# Patient Record
Sex: Male | Born: 1970 | Race: Black or African American | Hispanic: No | Marital: Married | Smoking: Current some day smoker
Health system: Southern US, Community
[De-identification: ages and names within clinical notes are randomized; demographics above are authoritative.]

## PROBLEM LIST (undated history)

## (undated) DIAGNOSIS — I10 Essential (primary) hypertension: Principal | ICD-10-CM

## (undated) DIAGNOSIS — E785 Hyperlipidemia, unspecified: Secondary | ICD-10-CM

## (undated) DIAGNOSIS — I639 Cerebral infarction, unspecified: Secondary | ICD-10-CM

## (undated) HISTORY — DX: Essential (primary) hypertension: I10

## (undated) HISTORY — DX: Hyperlipidemia, unspecified: E78.5

## (undated) HISTORY — DX: Cerebral infarction, unspecified: I63.9

## (undated) HISTORY — PX: ACHILLES TENDON REPAIR: SUR1153

## (undated) HISTORY — PX: NO PAST SURGERIES: SHX2092

---

## 2011-10-03 DIAGNOSIS — R7989 Other specified abnormal findings of blood chemistry: Secondary | ICD-10-CM | POA: Insufficient documentation

## 2011-10-03 DIAGNOSIS — K429 Umbilical hernia without obstruction or gangrene: Secondary | ICD-10-CM | POA: Insufficient documentation

## 2011-10-03 DIAGNOSIS — R945 Abnormal results of liver function studies: Secondary | ICD-10-CM | POA: Insufficient documentation

## 2011-12-18 ENCOUNTER — Encounter: Payer: Self-pay | Admitting: Internal Medicine

## 2011-12-18 ENCOUNTER — Ambulatory Visit (INDEPENDENT_AMBULATORY_CARE_PROVIDER_SITE_OTHER): Payer: BC Managed Care – PPO | Admitting: Internal Medicine

## 2011-12-18 VITALS — BP 122/80 | HR 68 | Temp 97.9°F | Ht 74.0 in | Wt 245.0 lb

## 2011-12-18 DIAGNOSIS — I10 Essential (primary) hypertension: Secondary | ICD-10-CM

## 2011-12-18 DIAGNOSIS — E785 Hyperlipidemia, unspecified: Secondary | ICD-10-CM

## 2011-12-18 HISTORY — DX: Essential (primary) hypertension: I10

## 2011-12-18 HISTORY — DX: Hyperlipidemia, unspecified: E78.5

## 2011-12-18 MED ORDER — AMLODIPINE BESY-BENAZEPRIL HCL 10-20 MG PO CAPS: 1.0000 | ORAL_CAPSULE | Freq: Every day | ORAL | Status: DC

## 2011-12-18 NOTE — Assessment & Plan Note (Signed)
Recently diagnosed with hypertension, was a started on Lotrel 5/10 few weeks ago, no apparent side effects, reportedly had his blood was checked after he started that medication. Initially, BP was great but in the last couple of weeks, BP was a little higher , yesterday was 160/94 and had palpitation, went to a urgent care, EKG reportedly normal. Feels well today. Plan: Get records, I'm  interested to see a  BMP after he started Lotrel, also see his EKG;  if he didn't have a BMP after ACEi initiation,I will ask patient to come back for blood work Increase Lotrel to 10 /20 Patient will call if he feels the medication is too strong for him ie if BP is dropping less than 110/60 Encourage to stay active, we also discussed a low-salt diet Followup 6 weeks for a physical

## 2011-12-18 NOTE — Patient Instructions (Addendum)
Check the  blood pressure 2 or 3 times a week, be sure it is between 110/60 and 135/85. If it is consistently higher or lower, let me know Get the records, labs and EKGs from your previous MD    Sodium-Controlled Diet Sodium is a mineral. It is found in many foods. Sodium may be found naturally or added during the making of a food. The most common form of sodium is salt, which is made up of sodium and chloride. Reducing your sodium intake involves changing your eating habits. The following guidelines will help you reduce the sodium in your diet:  Stop using the salt shaker.  Use salt sparingly in cooking and baking.  Substitute with sodium-free seasonings and spices.  Do not use a salt substitute (potassium chloride) without your caregiver's permission.  Include a variety of fresh, unprocessed foods in your diet.  Limit the use of processed and convenience foods that are high in sodium. USE THE FOLLOWING FOODS SPARINGLY: Breads/Starches  Commercial bread stuffing, commercial pancake or waffle mixes, coating mixes. Waffles. Croutons. Prepared (boxed or frozen) potato, rice, or noodle mixes that contain salt or sodium. Salted [place] fries or hash browns. Salted popcorn, breads, crackers, chips, or snack foods. Vegetables  Vegetables canned with salt or prepared in cream, butter, or cheese sauces. Sauerkraut. Tomato or vegetable juices canned with salt.  Fresh vegetables are allowed if rinsed thoroughly. Fruit  Fruit is okay to eat. Meat and Meat Substitutes  Salted or smoked meats, such as bacon or Canadian bacon, chipped or corned beef, hot dogs, salt pork, luncheon meats, pastrami, ham, or sausage. Canned or smoked fish, poultry, or meat. Processed cheese or cheese spreads, blue or Roquefort cheese. Battered or frozen fish products. Prepared spaghetti sauce. Baked beans. Reuben sandwiches. Salted nuts. Caviar. Milk  Limit buttermilk to 1 cup per week. Soups and Combination  Foods  Bouillon cubes, canned or dried soups, broth, consomm. Convenience (frozen or packaged) dinners with more than 600 mg sodium. Pot pies, pizza, Asian food, fast food cheeseburgers, and specialty sandwiches. Desserts and Sweets  Regular (salted) desserts, pie, commercial fruit snack pies, commercial snack cakes, canned puddings.  Eat desserts and sweets in moderation. Fats and Oils  Gravy mixes or canned gravy. No more than 1 to 2 tbs of salad dressing. Chip dips.  Eat fats and oils in moderation. Beverages  See those listed under the vegetables and milk groups. Condiments  Ketchup, mustard, meat sauces, salsa, regular (salted) and lite soy sauce or mustard. Dill pickles, olives, meat tenderizer. Prepared horseradish or pickle relish. Dutch-processed cocoa. Baking powder or baking soda used medicinally. Worcestershire sauce. "Light" salt. Salt substitute, unless approved by your caregiver. Document Released: 08/24/2001 Document Revised: 05/27/2011 Document Reviewed: 03/27/2009 [facility] Patient Information 2013 [place], [place].

## 2011-12-18 NOTE — Progress Notes (Signed)
  Subjective:    Patient ID: Tony Walker, male    DOB: [birth year], 41 y.o.   MRN: [id number]  HPI New patient, here to establish Patient was diagnosed with hypertension few weeks ago, started on Lotrel, reports that after he started such medicine his blood  was a recheck and he was told it was okay. BP initially was very good, he is trying to eat healthier and is getting more active. In the last couple of weeks, his BP was slightly elevated, he admits he has been eating more salt lately and has forgotten a few doses of medication. Yesterday, his BP went up to 160/94, he had some palpitation, went to urgent care, reportedly EKG was normal and he was recommended to continue with Lotrel 5-10 and work on his lifestyle. Today he feels well.   Past medical history Hypertension, dx 2013  Hyperlipidemia  Past surgical history None  Social history Moved to the area ~ 2011, from [place] Occupation-- Psychologist, occupational tobacco-- used to smoke cigars, d/c ~ 03-2011 ETOH-- socially  Diet-- healthier recently  Exercise-- goes to the [employer] ~ 3-4/week  Family history DM-HTN-hyperlipidemia-- F and M side  CAD-- no Stroke-- no Colon cancer-- no Prostate cancer-- no Breast ca-- aunts   Review of Systems Denies lower extremity edema, cough or wheezing No nausea, vomiting, diarrhea No chest pain but again yesterday he had some palpitations.    Objective:   Physical Exam General -- alert, well-developed, and BMI 31. No apparent distress.  Neck --no thyromegaly HEENT -- not pale or jaundice Lungs -- normal respiratory effort, no intercostal retractions, no accessory muscle use, and normal breath sounds.   Heart-- normal rate, regular rhythm, no murmur, and no gallop.   Extremities-- no pretibial edema bilaterally  Neurologic-- alert & oriented X3 and strength normal in all extremities. Psych-- Cognition and judgment appear intact. Alert and cooperative with normal attention span and concentration.   not anxious appearing and not depressed appearing.       Assessment & Plan:  Td 2006 patient

## 2012-01-04 ENCOUNTER — Telehealth: Payer: Self-pay | Admitting: Internal Medicine

## 2012-01-04 DIAGNOSIS — I10 Essential (primary) hypertension: Secondary | ICD-10-CM

## 2012-01-04 NOTE — Telephone Encounter (Signed)
Advise patient, I haven't seen any old records so far. Please schedule ---->  BMP DX hypertension

## 2012-01-06 NOTE — Telephone Encounter (Signed)
Left detailed msg for pt to return call to schedule lab appt. Lab orders entered.

## 2012-01-29 ENCOUNTER — Emergency Department (HOSPITAL_BASED_OUTPATIENT_CLINIC_OR_DEPARTMENT_OTHER)
Admission: EM | Admit: 2012-01-29 | Discharge: 2012-01-29 | Disposition: A | Discharge status: Home / Self Care | Payer: BC Managed Care – PPO | Attending: Emergency Medicine | Admitting: Emergency Medicine

## 2012-01-29 ENCOUNTER — Emergency Department (HOSPITAL_BASED_OUTPATIENT_CLINIC_OR_DEPARTMENT_OTHER): Payer: BC Managed Care – PPO

## 2012-01-29 ENCOUNTER — Encounter (HOSPITAL_BASED_OUTPATIENT_CLINIC_OR_DEPARTMENT_OTHER): Payer: Self-pay | Admitting: *Deleted

## 2012-01-29 ENCOUNTER — Telehealth: Payer: Self-pay | Admitting: Internal Medicine

## 2012-01-29 DIAGNOSIS — I10 Essential (primary) hypertension: Secondary | ICD-10-CM

## 2012-01-29 DIAGNOSIS — E785 Hyperlipidemia, unspecified: Secondary | ICD-10-CM | POA: Insufficient documentation

## 2012-01-29 LAB — BASIC METABOLIC PANEL
Calcium: 10 mg/dL (ref 8.4–10.5)
GFR calc Af Amer: 85 mL/min — ABNORMAL LOW (ref >90)
GFR calc non Af Amer: 74 mL/min — ABNORMAL LOW (ref >90)
Glucose, Bld: 106 mg/dL — ABNORMAL HIGH (ref 70–99)
Sodium: 139 mEq/L (ref 135–145)

## 2012-01-29 LAB — CBC
Hemoglobin: 12.9 g/dL — ABNORMAL LOW (ref 13.0–17.0)
MCH: 30.6 pg (ref 26.0–34.0)
MCHC: 33.2 g/dL (ref 30.0–36.0)
RDW: 12 % (ref 11.5–15.5)

## 2012-01-29 NOTE — Telephone Encounter (Signed)
Caller: [name]/Patient; Patient Name: Tony Walker; PCP: Willow Ora; Best Callback Phone Number: [phone]. States that the past couple of days blood pressure has been up and down. At night heart "pounds" and feels like he is going to have a heart attack some nights. Has missed one day of his medication on Monday, 01/27/12. Blood pressure normally runs 128-140/80's. Was recently changed from 5MG  to 10MG  of Amlodipine approximately 6 weeks ago. Has sweating episodes. Blood pressure is now 181/113, heart rate is 93 and having blurred vision. Advised ED per guidelines.

## 2012-01-29 NOTE — Telephone Encounter (Signed)
To Md for review      KP

## 2012-01-29 NOTE — ED Provider Notes (Signed)
History     CSN: [id number]  Arrival date & time 01/29/12  1758   First MD Initiated Contact with Patient 01/29/12 1810      Chief Complaint  Patient presents with  . Hypertension    (Consider location/radiation/quality/duration/timing/severity/associated sxs/prior treatment) HPI Comments: Pt states that he started having his blood pressure treated about 2 months ago:pt states that the last 2 nights he has had heart palpitations:pt denies cp or sob:pt states that he was having the symptoms earlier today and his bp was 180/113 and he had blurred vision bilaterally which has since resolved  Patient is a 41 y.o. male presenting with hypertension. The history is provided by the patient. No language interpreter was used.  Hypertension This is a chronic problem. The current episode started yesterday. The problem occurs constantly. The problem has been unchanged. Pertinent negatives include no chest pain, congestion, fatigue, fever, joint swelling, neck pain, numbness or vomiting. Nothing aggravates the symptoms. He has tried nothing for the symptoms.    Past Medical History  Diagnosis Date  . Hypertension 12/18/2011  . Hyperlipidemia 12/18/2011    History reviewed. No pertinent past surgical history.  History reviewed. No pertinent family history.  History  Substance Use Topics  . Smoking status: Never Smoker   . Smokeless tobacco: Not on file  . Alcohol Use: No      Review of Systems  Constitutional: Negative for fever and fatigue.  HENT: Negative for congestion and neck pain.   Cardiovascular: Negative for chest pain.  Gastrointestinal: Negative for vomiting.  Musculoskeletal: Negative for joint swelling.  Neurological: Negative for numbness.    Allergies  Review of patient's allergies indicates no known allergies.  Home Medications   Current Outpatient Rx  Name  Route  Sig  Dispense  Refill  . AMLODIPINE BESY-BENAZEPRIL HCL 10-20 MG PO CAPS   Oral   Take 1  capsule by mouth daily.   30 capsule   3   . OMEGA-3 FATTY ACIDS 1000 MG PO CAPS   Oral   Take 1 g by mouth daily.         Marland Kitchen ONE-DAILY MULTI VITAMINS PO TABS   Oral   Take 1 tablet by mouth daily.           BP 162/102  Pulse 92  Temp 98.5 F (36.9 C) (Oral)  Resp 16  Ht 6\' 4"  (1.93 m)  Wt 245 lb (111.131 kg)  BMI 29.82 kg/m2  SpO2 100%  Physical Exam  Nursing note and vitals reviewed. Constitutional: He is oriented to person, place, and time. He appears well-developed and well-nourished.  HENT:  Head: Normocephalic and atraumatic.  Right Ear: External ear normal.  Left Ear: External ear normal.  Eyes: Conjunctivae normal and EOM are normal.  Neck: Normal range of motion. Neck supple.  Cardiovascular: Normal rate and regular rhythm.   Pulmonary/Chest: Effort normal and breath sounds normal.  Abdominal: Soft. Bowel sounds are normal. There is no tenderness.  Musculoskeletal: Normal range of motion.  Neurological: He is alert and oriented to person, place, and time. No cranial nerve deficit. He exhibits normal muscle tone. Coordination normal.  Skin: Skin is warm and dry.  Psychiatric: He has a normal mood and affect.    ED Course  Procedures (including critical care time)  Labs Reviewed  CBC - Abnormal; Notable for the following:    RBC 4.21 (*)     Hemoglobin 12.9 (*)     HCT 38.9 (*)  All other components within normal limits  BASIC METABOLIC PANEL - Abnormal; Notable for the following:    Glucose, Bld 106 (*)     GFR calc non Af Amer 74 (*)     GFR calc Af Amer 85 (*)     All other components within normal limits  TROPONIN I   Dg Chest 2 View  01/29/2012  *RADIOLOGY REPORT*  Clinical Data: Tachycardia.  Elevated blood pressure  CHEST - 2 VIEW  Comparison: None.  Findings: The heart size and mediastinal contours are within normal limits.  Both lungs are clear.  The visualized skeletal structures are unremarkable.  IMPRESSION: Negative exam.    Original Report Authenticated By: Signa Kell, M.D.    Ct Head Wo Contrast  01/29/2012  *RADIOLOGY REPORT*  Clinical Data: Hypertension.  Blurred vision.  Dizziness.  CT HEAD WITHOUT CONTRAST  Technique:  Contiguous axial images were obtained from the base of the skull through the vertex without contrast.  Comparison: None.  Findings: The brain has a normal appearance without evidence of malformation, atrophy, old or acute infarction, mass lesion, hemorrhage, hydrocephalus or extra-axial collection peri.  The calvarium is unremarkable.  Sinuses, middle ears and mastoids are clear.  IMPRESSION: Normal head CT   Original Report Authenticated By: Paulina Fusi, M.D.     Date: 01/29/2012  Rate: 72  Rhythm: normal sinus rhythm  QRS Axis: normal  Intervals: normal  ST/T Wave abnormalities: normal  Conduction Disutrbances:none  Narrative Interpretation:   Old EKG Reviewed: none available    1. HTN (hypertension)       MDM  Pt is symptom free at this time::pt is okay to follow up with pcp:don't think any changes in medications need to be made at this time        Teressa Lower, NP 01/29/12 2115

## 2012-01-29 NOTE — ED Notes (Signed)
Pt c/o hypertension x 2 days with blurred vision

## 2012-01-30 NOTE — Telephone Encounter (Signed)
lmovm for pt to return call.  

## 2012-01-30 NOTE — Telephone Encounter (Signed)
Patient went to the ER, please arrange a office visit for followup of

## 2012-01-30 NOTE — ED Provider Notes (Signed)
Medical screening examination/treatment/procedure(s) were performed by non-physician practitioner and as supervising physician I was immediately available for consultation/collaboration.   Yarethzy Croak, MD 01/30/12 2317 

## 2012-02-04 NOTE — Telephone Encounter (Signed)
Pt has appt scheduled 11.27.13.

## 2012-02-12 ENCOUNTER — Encounter: Payer: BC Managed Care – PPO | Admitting: Internal Medicine

## 2012-02-12 DIAGNOSIS — Z0289 Encounter for other administrative examinations: Secondary | ICD-10-CM

## 2012-05-08 ENCOUNTER — Other Ambulatory Visit: Payer: Self-pay | Admitting: Internal Medicine

## 2012-05-08 NOTE — Telephone Encounter (Signed)
Refill done.  

## 2012-06-26 ENCOUNTER — Telehealth: Payer: Self-pay | Admitting: Internal Medicine

## 2012-06-26 NOTE — Telephone Encounter (Signed)
Okay with me 

## 2012-06-26 NOTE — Telephone Encounter (Signed)
Patient is moving closer to our office and he would like to switch from Dr. Drue Novel to Dr. Yetta Barre, Please advise

## 2012-06-26 NOTE — Telephone Encounter (Signed)
Ok with me 

## 2012-08-12 ENCOUNTER — Ambulatory Visit: Payer: BC Managed Care – PPO | Admitting: Internal Medicine

## 2012-10-20 ENCOUNTER — Telehealth: Payer: Self-pay | Admitting: Internal Medicine

## 2012-10-20 NOTE — Telephone Encounter (Signed)
It appears that pt. PCP is Dr. Sanda Linger at this time, not [facility].

## 2012-10-20 NOTE — Telephone Encounter (Signed)
Pt. Requesting refill on amlodipine-benazepril.  Last OV with Dr. Drue Novel 10.2.13 Switched providers to Dr. Sanda Linger on 4.15.14 but never went to see him (see documentation below) Last filled 2.21.14 #30 with 3 refills by our office.  Please advise.

## 2012-10-20 NOTE — Telephone Encounter (Signed)
I have never seen him.

## 2012-10-22 NOTE — Telephone Encounter (Signed)
Refill done for one month per verbal orders Dr. Drue Novel. Lmovm for pt. To contact office to encourage him to schedule OV with his PCP who is listed as Dr. Yetta Barre.

## 2012-12-04 ENCOUNTER — Other Ambulatory Visit: Payer: Self-pay | Admitting: Internal Medicine

## 2012-12-04 DIAGNOSIS — I10 Essential (primary) hypertension: Secondary | ICD-10-CM

## 2012-12-04 NOTE — Telephone Encounter (Signed)
Reill for lotrel sent to [facility] on [address]

## 2013-01-21 ENCOUNTER — Other Ambulatory Visit: Payer: Self-pay | Admitting: Internal Medicine

## 2013-01-22 NOTE — Telephone Encounter (Signed)
Amlodipine refill sent to pharmacy 

## 2013-02-18 ENCOUNTER — Other Ambulatory Visit: Payer: Self-pay | Admitting: Internal Medicine

## 2013-02-18 NOTE — Telephone Encounter (Signed)
rx refilled per protocol. DJR  

## 2013-04-05 ENCOUNTER — Other Ambulatory Visit: Payer: Self-pay | Admitting: Internal Medicine

## 2013-07-05 ENCOUNTER — Other Ambulatory Visit: Payer: Self-pay | Admitting: Internal Medicine

## 2013-07-06 NOTE — Telephone Encounter (Signed)
Rx sent to the pharmacy by e-script.//AB/CMA 

## 2013-07-07 ENCOUNTER — Ambulatory Visit: Payer: BC Managed Care – PPO | Admitting: Internal Medicine

## 2013-07-09 ENCOUNTER — Ambulatory Visit (INDEPENDENT_AMBULATORY_CARE_PROVIDER_SITE_OTHER): Payer: BC Managed Care – PPO | Admitting: Internal Medicine

## 2013-07-09 ENCOUNTER — Encounter: Payer: Self-pay | Admitting: Internal Medicine

## 2013-07-09 VITALS — BP 122/76 | HR 77 | Temp 97.9°F | Wt 260.0 lb

## 2013-07-09 DIAGNOSIS — I10 Essential (primary) hypertension: Secondary | ICD-10-CM

## 2013-07-09 LAB — CBC WITH DIFFERENTIAL/PLATELET
BASOS ABS: 0.1 10*3/uL (ref 0.0–0.1)
Basophils Relative: 0.8 % (ref 0.0–3.0)
EOS ABS: 0.1 10*3/uL (ref 0.0–0.7)
Eosinophils Relative: 1.4 % (ref 0.0–5.0)
HCT: 43 % (ref 39.0–52.0)
HEMOGLOBIN: 14.1 g/dL (ref 13.0–17.0)
LYMPHS PCT: 33 % (ref 12.0–46.0)
Lymphs Abs: 3.1 10*3/uL (ref 0.7–4.0)
MCHC: 32.8 g/dL (ref 30.0–36.0)
MCV: 94.6 fl (ref 78.0–100.0)
MONOS PCT: 12.9 % — AB (ref 3.0–12.0)
Monocytes Absolute: 1.2 10*3/uL — ABNORMAL HIGH (ref 0.1–1.0)
NEUTROS ABS: 4.8 10*3/uL (ref 1.4–7.7)
NEUTROS PCT: 51.9 % (ref 43.0–77.0)
PLATELETS: 294 10*3/uL (ref 150.0–400.0)
RBC: 4.54 Mil/uL (ref 4.22–5.81)
RDW: 13.4 % (ref 11.5–14.6)
WBC: 9.3 10*3/uL (ref 4.5–10.5)

## 2013-07-09 MED ORDER — AMLODIPINE BESY-BENAZEPRIL HCL 10-20 MG PO CAPS: ORAL_CAPSULE | ORAL | Status: DC

## 2013-07-09 NOTE — Assessment & Plan Note (Signed)
Reports good compliance of medication, BP today is great, reports normal ambulatory BPs. Plan:  Labs Refill medications Come back in 3 months for a physical, fasting. I like to check his cholesterol, at some point was elevated.

## 2013-07-09 NOTE — Progress Notes (Signed)
   Subjective:    Patient ID: Tony Walker, male    DOB: 1970-10-22, 43 y.o.   MRN: [id number]  DOS:  07/09/2013 Type of  visit: Last visit in 2013, here for followup. History of hypertension, reports he has been taking his BP medications regularly. Ambulatory BPs usually 120, 130/80.    ROS In general feeling great, has been doing very well with activity diet, active lately 3 or 4 times a week. Denies chest pain, difficulty breathing or extremity edema Denies cough or sputum production  Past Medical History  Diagnosis Date  . Hypertension 12/18/2011  . Hyperlipidemia 12/18/2011    History reviewed. No pertinent past surgical history.  History   Social History  . Marital Status: Married    Spouse Name: N/A    Number of Children: N/A  . Years of Education: N/A   Occupational History  . Not on file.   Social History Main Topics  . Smoking status: Never Smoker   . Smokeless tobacco: Not on file  . Alcohol Use: No  . Drug Use: No  . Sexual Activity: No   Other Topics Concern  . Not on file   Social History Narrative  . No narrative on file    Family history DM-HTN-hyperlipidemia-- F and M side   CAD-- no Stroke-- no Colon cancer-- no Prostate cancer-- no Breast ca-- aunts    Medication List       This list is accurate as of: 07/09/13 11:59 PM.  Always use your most recent med list.               amLODipine-benazepril 10-20 MG per capsule  Commonly known as:  LOTREL  TAKE 1 CAPSULE EVERY DAY -- NEED OFFICE VISIT FOR FURTHER REFILLS     fish oil-omega-3 fatty acids 1000 MG capsule  Take 1 g by mouth daily.     multivitamin tablet  Take 1 tablet by mouth daily.           Objective:   Physical Exam BP 122/76  Pulse 77  Temp(Src) 97.9 F (36.6 C)  Wt 260 lb (117.935 kg)  SpO2 99%  General -- alert, well-developed, NAD.  Lungs -- normal respiratory effort, no intercostal retractions, no accessory muscle use, and normal breath sounds.  Heart--  normal rate, regular rhythm, no murmur.  Extremities-- no pretibial edema bilaterally  Neurologic--  alert & oriented X3. Speech normal, gait normal, strength normal in all extremities.  Psych-- Cognition and judgment appear intact. Cooperative with normal attention span and concentration. No anxious or depressed appearing.   Assessment & Plan:

## 2013-07-09 NOTE — Progress Notes (Signed)
Pre visit review using our clinic review tool, if applicable. No additional management support is needed unless otherwise documented below in the visit note. 

## 2013-07-09 NOTE — Patient Instructions (Signed)
Get your blood work before you leave   Next visit is for a physical exam in3 months  , fasting Please make an appointment     

## 2013-07-12 ENCOUNTER — Telehealth: Payer: Self-pay | Admitting: Internal Medicine

## 2013-07-12 ENCOUNTER — Encounter: Payer: Self-pay | Admitting: Internal Medicine

## 2013-07-12 LAB — COMPREHENSIVE METABOLIC PANEL
ALBUMIN: 4.9 g/dL (ref 3.5–5.2)
ALT: 53 U/L (ref 0–53)
AST: 57 U/L — ABNORMAL HIGH (ref 0–37)
Alkaline Phosphatase: 47 U/L (ref 39–117)
BUN: 19 mg/dL (ref 6–23)
CALCIUM: 10.1 mg/dL (ref 8.4–10.5)
CHLORIDE: 106 meq/L (ref 96–112)
CO2: 25 meq/L (ref 19–32)
CREATININE: 1.1 mg/dL (ref 0.4–1.5)
GFR: 93.12 mL/min (ref >60.00)
Glucose, Bld: 75 mg/dL (ref 70–99)
POTASSIUM: 3.9 meq/L (ref 3.5–5.1)
Sodium: 141 mEq/L (ref 135–145)
Total Bilirubin: 0.5 mg/dL (ref 0.3–1.2)
Total Protein: 8.2 g/dL (ref 6.0–8.3)

## 2013-07-12 LAB — TSH: TSH: 0.5 u[IU]/mL (ref 0.35–5.50)

## 2013-07-12 NOTE — Telephone Encounter (Signed)
Relevant patient education assigned to patient using Emmi. ° °

## 2013-07-13 ENCOUNTER — Encounter: Payer: Self-pay | Admitting: *Deleted

## 2013-07-15 ENCOUNTER — Telehealth: Payer: Self-pay | Admitting: Internal Medicine

## 2013-07-15 NOTE — Telephone Encounter (Signed)
Shows as a rare side effect.   Please advise.

## 2013-07-15 NOTE — Telephone Encounter (Signed)
Caller name:Yuriel Nodine  Relation to WU:XLKGMWNpt:patient Call back [phone] Pharmacy:  Reason for call: Patient called to see if he could change his blood pressure medications. Patient's dentist states that his gums are bleeding due to his blood pressure medication. Please advise

## 2013-08-14 ENCOUNTER — Other Ambulatory Visit: Payer: Self-pay | Admitting: Internal Medicine

## 2013-08-19 ENCOUNTER — Telehealth: Payer: Self-pay

## 2013-08-19 NOTE — Telephone Encounter (Signed)
Left message for call back Non identifiable No pertinent data in chart 

## 2013-08-20 ENCOUNTER — Encounter: Payer: BC Managed Care – PPO | Admitting: Internal Medicine

## 2013-08-20 DIAGNOSIS — Z0289 Encounter for other administrative examinations: Secondary | ICD-10-CM

## 2013-08-23 NOTE — Telephone Encounter (Signed)
No show for appt. 

## 2013-09-22 ENCOUNTER — Other Ambulatory Visit: Payer: Self-pay | Admitting: Internal Medicine

## 2013-10-14 ENCOUNTER — Ambulatory Visit (INDEPENDENT_AMBULATORY_CARE_PROVIDER_SITE_OTHER): Payer: BC Managed Care – PPO | Admitting: Medical

## 2013-10-14 ENCOUNTER — Encounter: Payer: Self-pay | Admitting: Medical

## 2013-10-14 VITALS — BP 123/81 | HR 100 | Temp 98.2°F | Wt 266.0 lb

## 2013-10-14 DIAGNOSIS — K0510 Chronic gingivitis, plaque induced: Secondary | ICD-10-CM

## 2013-10-14 DIAGNOSIS — I10 Essential (primary) hypertension: Secondary | ICD-10-CM

## 2013-10-14 MED ORDER — BENAZEPRIL HCL 40 MG PO TABS: 40.0000 mg | ORAL_TABLET | Freq: Every day | ORAL | Status: DC

## 2013-10-14 MED ORDER — MAGIC MOUTHWASH W/LIDOCAINE: 5.0000 mL | Freq: Four times a day (QID) | ORAL | Status: DC | PRN

## 2013-10-14 NOTE — Progress Notes (Signed)
   Subjective:    Patient ID: Tony Walker, male    DOB: [birth year], 43 y.o.   MRN: [id number]  HPI  Pt in states his gums were swollen and he states it was associated with lotrel bp medications. First time gingivitis was severe enough was around time he got braces removed in December(Saw dentist). Pt was on lotrel before then December and noticed occasional bleeding of bums. This past march had very significant gingivitis. Dental procedure scaling was done. But gingivitis reoccured around April again. Pt states dentist defintivley stated bp med is cause. Pt wants to change bp med. He states ever since being on med noticed pattern. Never had any lip swelling associated. No sob or wheezing.    No fevers, no chills, no mouth ulcers during course of intermittent recurrent gingivitis.  No chronic illness/immune deficiency.  Review of Systems  Constitutional: Negative for fever, chills and fatigue.  HENT: Negative for congestion, dental problem, ear pain, postnasal drip, rhinorrhea, sinus pressure, sneezing and tinnitus.        Gum swelling upper gums.  Respiratory: Negative for cough and wheezing.   Cardiovascular: Negative for chest pain and palpitations.  Neurological: Negative.   Hematological: Negative for adenopathy. Does not bruise/bleed easily.  Note cbc 3 month ago platelets were normal.     Objective:   Physical Exam  Constitutional: He is oriented to person, place, and time. He appears well-developed and well-nourished. No distress.  HENT:  Head: Normocephalic and atraumatic.  Nose: Nose normal.  Mouth/Throat: Oropharynx is clear and moist. No oropharyngeal exudate.  Rt side upper gums/gingiva mild swollen. Other regions not swollen. No bleeding. Buccal mucosa normal. No lesions.  Neck: Normal range of motion. Neck supple. No thyromegaly present.  Cardiovascular: Normal rate, regular rhythm and normal heart sounds.   Pulmonary/Chest: Effort normal and breath sounds normal. No  respiratory distress. He has no wheezes. He has no rales. He exhibits no tenderness.  Lymphadenopathy:    He has no cervical adenopathy.  Neurological: He is alert and oriented to person, place, and time.  Skin: He is not diaphoretic.            Assessment & Plan:  -Note if gingivitis persists then would initiate differential diagnosis work up.

## 2013-10-14 NOTE — Patient Instructions (Addendum)
It appears that amlodipine component of lotrel  is more likley cause of gingivitis(After talking with pharmacist). Will prescribe benazapril 40 mg dose to take one tab a day. Please follow up in 2wks for blood pressure check. Stop lotrel. If BP increasing over next two weeks prior to follow please return as needed.

## 2013-10-14 NOTE — Assessment & Plan Note (Signed)
Decided to dc lotrel and increase benazepril dose. Rx magic mouth wash rx. Instruction 5 ml swish and spit qid. Also can use low concentration salt water gargles as well.

## 2013-10-14 NOTE — Progress Notes (Signed)
Pre visit review using our clinic review tool, if applicable. No additional management support is needed unless otherwise documented below in the visit note. 

## 2013-10-14 NOTE — Assessment & Plan Note (Signed)
His bp is controlled up but may have side effect gingivitis. Both pt and dentist very strongly convinced. By review and discussion with [facility] pharmacist I think amdodipine component more likely. Pt not describing lipid or face swelling. No sob or wheezing. DC lotrel today and rx benazepril 40 mg q day. Follow up in 2 wks bp check.

## 2013-10-18 ENCOUNTER — Other Ambulatory Visit: Payer: Self-pay | Admitting: General Practice

## 2013-10-18 MED ORDER — MAGIC MOUTHWASH W/LIDOCAINE: 5.0000 mL | Freq: Four times a day (QID) | ORAL | Status: DC | PRN

## 2013-10-27 ENCOUNTER — Ambulatory Visit (INDEPENDENT_AMBULATORY_CARE_PROVIDER_SITE_OTHER): Payer: BC Managed Care – PPO | Admitting: Medical

## 2013-10-27 ENCOUNTER — Telehealth: Payer: Self-pay | Admitting: Internal Medicine

## 2013-10-27 ENCOUNTER — Encounter: Payer: Self-pay | Admitting: Medical

## 2013-10-27 VITALS — BP 140/90 | HR 78 | Temp 98.5°F | Wt 266.4 lb

## 2013-10-27 DIAGNOSIS — I10 Essential (primary) hypertension: Secondary | ICD-10-CM

## 2013-10-27 DIAGNOSIS — R5383 Other fatigue: Secondary | ICD-10-CM

## 2013-10-27 DIAGNOSIS — R5381 Other malaise: Secondary | ICD-10-CM | POA: Insufficient documentation

## 2013-10-27 DIAGNOSIS — K0510 Chronic gingivitis, plaque induced: Secondary | ICD-10-CM

## 2013-10-27 LAB — CBC WITH DIFFERENTIAL/PLATELET
BASOS PCT: 1.1 % (ref 0.0–3.0)
Basophils Absolute: 0.1 10*3/uL (ref 0.0–0.1)
EOS PCT: 2.7 % (ref 0.0–5.0)
Eosinophils Absolute: 0.2 10*3/uL (ref 0.0–0.7)
HEMATOCRIT: 46.2 % (ref 39.0–52.0)
Hemoglobin: 15.4 g/dL (ref 13.0–17.0)
LYMPHS ABS: 2.5 10*3/uL (ref 0.7–4.0)
Lymphocytes Relative: 30.2 % (ref 12.0–46.0)
MCHC: 33.4 g/dL (ref 30.0–36.0)
MCV: 93.4 fl (ref 78.0–100.0)
MONO ABS: 0.8 10*3/uL (ref 0.1–1.0)
MONOS PCT: 9.5 % (ref 3.0–12.0)
Neutro Abs: 4.7 10*3/uL (ref 1.4–7.7)
Neutrophils Relative %: 56.5 % (ref 43.0–77.0)
PLATELETS: 339 10*3/uL (ref 150.0–400.0)
RBC: 4.94 Mil/uL (ref 4.22–5.81)
RDW: 13.7 % (ref 11.5–15.5)
WBC: 8.3 10*3/uL (ref 4.0–10.5)

## 2013-10-27 LAB — BASIC METABOLIC PANEL
BUN: 17 mg/dL (ref 6–23)
CHLORIDE: 105 meq/L (ref 96–112)
CO2: 23 mEq/L (ref 19–32)
Calcium: 9.8 mg/dL (ref 8.4–10.5)
Creatinine, Ser: 1.5 mg/dL (ref 0.4–1.5)
GFR: 67.78 mL/min (ref >60.00)
Glucose, Bld: 82 mg/dL (ref 70–99)
POTASSIUM: 4 meq/L (ref 3.5–5.1)
SODIUM: 138 meq/L (ref 135–145)

## 2013-10-27 MED ORDER — LOSARTAN POTASSIUM 100 MG PO TABS: 100.0000 mg | ORAL_TABLET | Freq: Every day | ORAL | Status: DC

## 2013-10-27 MED ORDER — CARVEDILOL 6.25 MG PO TABS: 6.2500 mg | ORAL_TABLET | Freq: Two times a day (BID) | ORAL | Status: DC

## 2013-10-27 NOTE — Telephone Encounter (Signed)
To discuss w/ Ramon DredgeEdward today (?losartan + coreg)

## 2013-10-27 NOTE — Patient Instructions (Addendum)
For your elevated blood pressure we will stop benazepril. Start losartan and carvedilol. Those have been sent to your pharmacy. Please get lab today. Check your bp every other day and document readings. Follow up in 1 month or prn.

## 2013-10-27 NOTE — Assessment & Plan Note (Signed)
Patient's blood pressure has been borderline and high at times. Various readings at his house, with the work nurse and moderate high-level here today. This occurred after stopping the amlodipine and increasing his benazepril. Discussed with Dr. Drue NovelPaz today and decision made to prescribed losartan and carvedilol. Patient was instructed to stop the benazepril. Advise him to check his blood pressure every 3 days and document readings. Followup in one month for blood pressure check. Will get a BMP and CBC today.

## 2013-10-27 NOTE — Telephone Encounter (Signed)
Pt stated benazepril (Lotensin) 40 MG is not working pt blood pressure is high. Pt has an appointment today with Ramon DredgeEdward at 10:30am. Please advise

## 2013-10-27 NOTE — Assessment & Plan Note (Signed)
Resolved after discontinuing amlodipine.

## 2013-10-27 NOTE — Assessment & Plan Note (Signed)
Patient mentioned this briefly today on interview. He attributes this to a lot of working out with p 90 X. He is having a BMP drawn for his blood pressure & and to add a CBC as well.

## 2013-10-27 NOTE — Telephone Encounter (Signed)
Caller name: Delfin Gantrica Felling  Relation to JX:BJYNpt:Self  Call back number: [phone]   Reason for call: pt stated benazepril (LOTENSIN) 40 MG tablet is working. Please advise

## 2013-10-27 NOTE — Progress Notes (Signed)
   Subjective:    Patient ID: Tony Walker, male    DOB: 1971/01/04, 43 y.o.   MRN: [id number]  HPI  Pt states his gums are a lot better. 3-4 days after stopping amlodipine he noticed improvement of gingivitis. Pt increased on benazapril to 40 mg. According to his machine bp was 165/91, 153/96 at home and nurse bp check was 140/92. When he first got here was 129/76. I checked and was 140/90.  Pt feels fine. No ha, no chest pains. Felt mild fatigue. He is working out doing P-90x.  Past Medical History  Diagnosis Date  . Hypertension 12/18/2011  . Hyperlipidemia 12/18/2011    History   Social History  . Marital Status: Married    Spouse Name: N/A    Number of Children: N/A  . Years of Education: N/A   Occupational History  . banker    Social History Main Topics  . Smoking status: Former Games developermoker  . Smokeless tobacco: Not on file     Comment: d/c cigars 2013  . Alcohol Use: Yes     Comment: socially   . Drug Use: No  . Sexual Activity: No   Other Topics Concern  . Not on file   Social History Narrative      Moved to the area ~ 2011, [place]          No past surgical history on file.  No family history on file.  No Known Allergies  Current Outpatient Prescriptions on File Prior to Visit  Medication Sig Dispense Refill  . Alum & Mag Hydroxide-Simeth (MAGIC MOUTHWASH W/LIDOCAINE) SOLN Take 5 mLs by mouth 4 (four) times daily as needed for mouth pain. Take 5 ml by mouth swish and spit four times daily  200 mL  0  . fish oil-omega-3 fatty acids 1000 MG capsule Take 1 g by mouth daily.      . Multiple Vitamin (MULTIVITAMIN) tablet Take 1 tablet by mouth daily.       No current facility-administered medications on file prior to visit.    BP 140/90  Pulse 78  Temp(Src) 98.5 F (36.9 C) (Oral)  Wt 266 lb 6 oz (120.827 kg)  SpO2 97%     Review of Systems  Constitutional: Negative for fever, chills and fatigue.  HENT: Negative.        Resolved gingivitis.    Respiratory: Negative for cough, choking, chest tightness and wheezing.   Cardiovascular: Negative for chest pain and palpitations.  Neurological: Negative for dizziness, tremors, seizures, syncope, weakness, light-headedness and headaches.       Objective:   Physical Exam  General Mental Status- Alert. General Appearance- Not in acute distress.  Skin General:-Color-Normal Color. Moisture- Normal Moisture.  Neck Carotid Arteries- normal upstroke and runoff. No JVD. No bruits.  Chest and Lung Exam Auscultation:Rhythm- Regular. Murmurs & Other Heart Sounds: Auscultation of the heart reveals- No murmurs.  Neurologic Cranial nerves III through XII grossly intact. EOMs normal no nystagmus. Equal 5/5 upper and lower extremity strength bilaterally. Negative Romberg.  Mouth Brief inspection of his mouth shows normal appearing gingiva.         Assessment & Plan:

## 2013-10-27 NOTE — Progress Notes (Signed)
Pre-visit discussion using our clinic review tool. No additional management support is needed unless otherwise documented below in the visit note.  

## 2013-10-28 ENCOUNTER — Telehealth: Payer: Self-pay

## 2013-10-28 NOTE — Telephone Encounter (Signed)
Caller name:Youssef Relation to pt: Call back [phone] Pharmacy:  Reason for call:[facility] called back to talk with you about his labs and he got the message that labs were normal but he was wondering what the labs were for.

## 2013-10-29 ENCOUNTER — Ambulatory Visit: Payer: BC Managed Care – PPO | Admitting: Internal Medicine

## 2013-10-29 NOTE — Telephone Encounter (Signed)
Pt notified. Done.

## 2013-12-08 ENCOUNTER — Other Ambulatory Visit: Payer: Self-pay

## 2013-12-08 ENCOUNTER — Telehealth: Payer: Self-pay

## 2013-12-08 MED ORDER — CARVEDILOL 6.25 MG PO TABS: 6.2500 mg | ORAL_TABLET | Freq: Two times a day (BID) | ORAL | Status: DC

## 2013-12-08 MED ORDER — LOSARTAN POTASSIUM 100 MG PO TABS: 100.0000 mg | ORAL_TABLET | Freq: Every day | ORAL | Status: DC

## 2013-12-08 NOTE — Telephone Encounter (Signed)
Sent to [facility].  

## 2013-12-08 NOTE — Telephone Encounter (Signed)
Tony Walker [phone] [facility] called he needs a refill on his carvedilol (COREG) 6.25 MG tablet, losartan (COZAAR) 100 MG tablet, he is completely out

## 2014-02-15 DIAGNOSIS — I639 Cerebral infarction, unspecified: Secondary | ICD-10-CM

## 2014-02-15 HISTORY — DX: Cerebral infarction, unspecified: I63.9

## 2014-02-18 ENCOUNTER — Inpatient Hospital Stay (HOSPITAL_COMMUNITY)
Admission: EM | Admit: 2014-02-18 | Discharge: 2014-02-20 | DRG: 065 | Disposition: A | Discharge status: Home / Self Care | Payer: BC Managed Care – PPO | Attending: Internal Medicine | Admitting: Internal Medicine

## 2014-02-18 ENCOUNTER — Emergency Department (HOSPITAL_COMMUNITY): Payer: BC Managed Care – PPO

## 2014-02-18 ENCOUNTER — Encounter (HOSPITAL_COMMUNITY): Payer: Self-pay | Admitting: *Deleted

## 2014-02-18 ENCOUNTER — Inpatient Hospital Stay (HOSPITAL_COMMUNITY): Payer: BC Managed Care – PPO

## 2014-02-18 DIAGNOSIS — Z79899 Other long term (current) drug therapy: Secondary | ICD-10-CM | POA: Diagnosis not present

## 2014-02-18 DIAGNOSIS — K760 Fatty (change of) liver, not elsewhere classified: Secondary | ICD-10-CM | POA: Diagnosis present

## 2014-02-18 DIAGNOSIS — N179 Acute kidney failure, unspecified: Secondary | ICD-10-CM | POA: Diagnosis present

## 2014-02-18 DIAGNOSIS — F1010 Alcohol abuse, uncomplicated: Secondary | ICD-10-CM | POA: Diagnosis present

## 2014-02-18 DIAGNOSIS — R740 Nonspecific elevation of levels of transaminase and lactic acid dehydrogenase [LDH]: Secondary | ICD-10-CM

## 2014-02-18 DIAGNOSIS — I10 Essential (primary) hypertension: Secondary | ICD-10-CM | POA: Diagnosis present

## 2014-02-18 DIAGNOSIS — I639 Cerebral infarction, unspecified: Secondary | ICD-10-CM | POA: Diagnosis present

## 2014-02-18 DIAGNOSIS — I674 Hypertensive encephalopathy: Secondary | ICD-10-CM | POA: Diagnosis present

## 2014-02-18 DIAGNOSIS — R7401 Elevation of levels of liver transaminase levels: Secondary | ICD-10-CM | POA: Diagnosis present

## 2014-02-18 DIAGNOSIS — E781 Pure hyperglyceridemia: Secondary | ICD-10-CM | POA: Diagnosis present

## 2014-02-18 DIAGNOSIS — R55 Syncope and collapse: Secondary | ICD-10-CM | POA: Diagnosis present

## 2014-02-18 DIAGNOSIS — Z87891 Personal history of nicotine dependence: Secondary | ICD-10-CM

## 2014-02-18 DIAGNOSIS — E785 Hyperlipidemia, unspecified: Secondary | ICD-10-CM | POA: Diagnosis present

## 2014-02-18 DIAGNOSIS — I160 Hypertensive urgency: Secondary | ICD-10-CM | POA: Diagnosis present

## 2014-02-18 DIAGNOSIS — G459 Transient cerebral ischemic attack, unspecified: Secondary | ICD-10-CM

## 2014-02-18 DIAGNOSIS — I6783 Posterior reversible encephalopathy syndrome: Secondary | ICD-10-CM

## 2014-02-18 DIAGNOSIS — G9340 Encephalopathy, unspecified: Secondary | ICD-10-CM | POA: Diagnosis present

## 2014-02-18 LAB — COMPREHENSIVE METABOLIC PANEL
ALT: 57 U/L — ABNORMAL HIGH (ref 0–53)
ANION GAP: 17 — AB (ref 5–15)
AST: 66 U/L — ABNORMAL HIGH (ref 0–37)
Albumin: 3.8 g/dL (ref 3.5–5.2)
Alkaline Phosphatase: 52 U/L (ref 39–117)
BILIRUBIN TOTAL: 0.7 mg/dL (ref 0.3–1.2)
BUN: 14 mg/dL (ref 6–23)
CALCIUM: 9 mg/dL (ref 8.4–10.5)
CHLORIDE: 101 meq/L (ref 96–112)
CO2: 24 meq/L (ref 19–32)
CREATININE: 1.37 mg/dL — AB (ref 0.50–1.35)
GFR calc Af Amer: 72 mL/min — ABNORMAL LOW (ref >90)
GFR, EST NON AFRICAN AMERICAN: 62 mL/min — AB (ref >90)
Glucose, Bld: 115 mg/dL — ABNORMAL HIGH (ref 70–99)
Potassium: 3.9 mEq/L (ref 3.7–5.3)
Sodium: 142 mEq/L (ref 137–147)
Total Protein: 7.4 g/dL (ref 6.0–8.3)

## 2014-02-18 LAB — ETHANOL: ALCOHOL ETHYL (B): 240 mg/dL — AB (ref 0–11)

## 2014-02-18 LAB — URINALYSIS, ROUTINE W REFLEX MICROSCOPIC
Bilirubin Urine: NEGATIVE
Glucose, UA: NEGATIVE mg/dL
Hgb urine dipstick: NEGATIVE
KETONES UR: NEGATIVE mg/dL
LEUKOCYTES UA: NEGATIVE
NITRITE: NEGATIVE
PROTEIN: NEGATIVE mg/dL
Specific Gravity, Urine: 1.016 (ref 1.005–1.030)
UROBILINOGEN UA: 0.2 mg/dL (ref 0.0–1.0)
pH: 5.5 (ref 5.0–8.0)

## 2014-02-18 LAB — CBC
HEMATOCRIT: 42.8 % (ref 39.0–52.0)
Hemoglobin: 14.4 g/dL (ref 13.0–17.0)
MCH: 34.1 pg — AB (ref 26.0–34.0)
MCHC: 33.6 g/dL (ref 30.0–36.0)
MCV: 101.4 fL — AB (ref 78.0–100.0)
PLATELETS: 295 10*3/uL (ref 150–400)
RBC: 4.22 MIL/uL (ref 4.22–5.81)
RDW: 13.2 % (ref 11.5–15.5)
WBC: 9 10*3/uL (ref 4.0–10.5)

## 2014-02-18 MED ORDER — ONDANSETRON HCL 4 MG/2ML IJ SOLN
4.0000 mg | Freq: Once | INTRAMUSCULAR | Status: AC
  Administered 2014-02-18: 4 mg via INTRAVENOUS

## 2014-02-18 MED ORDER — LORAZEPAM 2 MG/ML IJ SOLN
0.0000 mg | Freq: Two times a day (BID) | INTRAMUSCULAR | Status: DC
Start: 2014-02-20 — End: 2014-02-20

## 2014-02-18 MED ORDER — STROKE: EARLY STAGES OF RECOVERY BOOK
Freq: Once | Status: AC
  Administered 2014-02-19: 10:00:00
  Filled 2014-02-18: qty 1

## 2014-02-18 MED ORDER — SENNOSIDES-DOCUSATE SODIUM 8.6-50 MG PO TABS
1.0000 | ORAL_TABLET | Freq: Every evening | ORAL | Status: DC | PRN
  Filled 2014-02-18: qty 1

## 2014-02-18 MED ORDER — ASPIRIN 325 MG PO TABS: 325.0000 mg | ORAL_TABLET | Freq: Every day | ORAL | Status: DC

## 2014-02-18 MED ORDER — ACETAMINOPHEN 325 MG PO TABS
650.0000 mg | ORAL_TABLET | ORAL | Status: DC | PRN
  Administered 2014-02-18 – 2014-02-20 (×2): 650 mg via ORAL
  Filled 2014-02-18: qty 2

## 2014-02-18 MED ORDER — SODIUM CHLORIDE 0.9 % IV BOLUS (SEPSIS)
1000.0000 mL | Freq: Once | INTRAVENOUS | Status: AC
Start: 2014-02-18 — End: 2014-02-18
  Administered 2014-02-18: 1000 mL via INTRAVENOUS

## 2014-02-18 MED ORDER — FOLIC ACID 1 MG PO TABS
1.0000 mg | ORAL_TABLET | Freq: Every day | ORAL | Status: DC
  Administered 2014-02-19 – 2014-02-20 (×2): 1 mg via ORAL
  Filled 2014-02-18 (×3): qty 1

## 2014-02-18 MED ORDER — MAGIC MOUTHWASH W/LIDOCAINE
5.0000 mL | Freq: Four times a day (QID) | ORAL | Status: DC | PRN
Start: 2014-02-18 — End: 2014-02-20
  Filled 2014-02-18: qty 5

## 2014-02-18 MED ORDER — VITAMIN B-1 100 MG PO TABS
100.0000 mg | ORAL_TABLET | Freq: Every day | ORAL | Status: DC
  Administered 2014-02-19 – 2014-02-20 (×2): 100 mg via ORAL
  Filled 2014-02-18 (×3): qty 1

## 2014-02-18 MED ORDER — CARVEDILOL 6.25 MG PO TABS: 6.2500 mg | ORAL_TABLET | Freq: Two times a day (BID) | ORAL | Status: DC

## 2014-02-18 MED ORDER — ONDANSETRON HCL 4 MG/2ML IJ SOLN
4.0000 mg | Freq: Three times a day (TID) | INTRAMUSCULAR | Status: AC | PRN
  Administered 2014-02-18: 4 mg via INTRAVENOUS

## 2014-02-18 MED ORDER — ADULT MULTIVITAMIN W/MINERALS CH
1.0000 | ORAL_TABLET | Freq: Every day | ORAL | Status: DC
  Administered 2014-02-19 – 2014-02-20 (×2): 1 via ORAL
  Filled 2014-02-18 (×3): qty 1

## 2014-02-18 MED ORDER — TRAMADOL HCL 50 MG PO TABS
50.0000 mg | ORAL_TABLET | Freq: Once | ORAL | Status: AC
  Administered 2014-02-19: 50 mg via ORAL

## 2014-02-18 MED ORDER — HEPARIN SODIUM (PORCINE) 5000 UNIT/ML IJ SOLN
5000.0000 [IU] | Freq: Three times a day (TID) | INTRAMUSCULAR | Status: DC
  Filled 2014-02-18 (×6): qty 1

## 2014-02-18 MED ORDER — LORAZEPAM 2 MG/ML IJ SOLN
0.0000 mg | Freq: Four times a day (QID) | INTRAMUSCULAR | Status: DC
  Administered 2014-02-19: 2 mg via INTRAVENOUS

## 2014-02-18 MED ORDER — KETOROLAC TROMETHAMINE 30 MG/ML IJ SOLN
30.0000 mg | Freq: Once | INTRAMUSCULAR | Status: AC
  Administered 2014-02-18: 30 mg via INTRAVENOUS

## 2014-02-18 MED ORDER — ACETAMINOPHEN 650 MG RE SUPP: 650.0000 mg | RECTAL | Status: DC | PRN

## 2014-02-18 MED ORDER — METOPROLOL TARTRATE 1 MG/ML IV SOLN: 5.0000 mg | Freq: Once | INTRAVENOUS | Status: DC

## 2014-02-18 MED ORDER — ASPIRIN 325 MG PO TABS
325.0000 mg | ORAL_TABLET | Freq: Every day | ORAL | Status: DC
  Administered 2014-02-19 – 2014-02-20 (×2): 325 mg via ORAL
  Filled 2014-02-18 (×3): qty 1

## 2014-02-18 MED ORDER — LORAZEPAM 1 MG PO TABS: 1.0000 mg | ORAL_TABLET | Freq: Four times a day (QID) | ORAL | Status: DC | PRN

## 2014-02-18 MED ORDER — LOSARTAN POTASSIUM 50 MG PO TABS: 100.0000 mg | ORAL_TABLET | Freq: Every day | ORAL | Status: DC

## 2014-02-18 MED ORDER — THIAMINE HCL 100 MG/ML IJ SOLN
100.0000 mg | Freq: Every day | INTRAMUSCULAR | Status: DC
  Filled 2014-02-18 (×3): qty 1

## 2014-02-18 MED ORDER — ASPIRIN 300 MG RE SUPP
300.0000 mg | Freq: Every day | RECTAL | Status: DC
  Filled 2014-02-18 (×3): qty 1

## 2014-02-18 MED ORDER — LORAZEPAM 2 MG/ML IJ SOLN: 1.0000 mg | Freq: Four times a day (QID) | INTRAMUSCULAR | Status: DC | PRN

## 2014-02-18 MED ORDER — CARVEDILOL 6.25 MG PO TABS
6.2500 mg | ORAL_TABLET | Freq: Two times a day (BID) | ORAL | Status: AC
  Administered 2014-02-19: 6.25 mg via ORAL
  Filled 2014-02-18: qty 1

## 2014-02-18 NOTE — ED Notes (Signed)
Attempted report 

## 2014-02-18 NOTE — ED Notes (Signed)
Hyacinth MeekerMiller, MD made aware of pt c/o nausea

## 2014-02-18 NOTE — ED Notes (Addendum)
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

## 2014-02-18 NOTE — H&P (Signed)
[facility] History and Physical  Dolton Shaker JYN:[id number] DOB: 14-Dec-1970 DOA: 02/18/2014  Referring physician: ED physician PCP: Willow Ora, MD  Specialists:   Chief Complaint: AMS and blurry vison  HPI: Tony Walker is a 43 y.o. male with past medical history of hypertension, hyperlipidemia, who presents with altered mental status and blurry vision.  patient reports that he ran out of his blood pressure medications for 3-4 days. At about 3 PM, he was found to be laying down in the hallway in his working place by his coworker. Patient was confused and could not remember what happened to him, but no injury to any part of body. His states that he has been having blurry vision since this morning. He does not have a hearing loss, weakness or numbness in his extremities. He reports that he has been having mild tenderness over right flank area in the past 3 months. He did not remember any injury to area. He does not have fever, chills, chest pain, shortness breath, cough, nausea, vomiting, abdominal pain, seizure, incontinence, leg edema, or rashes. No symptoms of UTI. No tenderness over calf areas.  Patient reports that he has been drinking regularly, 3-4 times a week. Last drinking was yesterday. He drinks ENG 1 pint each time.  Work up in the ED demonstrates negative CT head for acute abnormalities and AKI. His blood pressure was elevated at 170/113 at arrival to ED, which improved to 154/96.  Review of Systems: As presented in the history of presenting illness, rest negative.  Where does patient live?  At home Can patient participate in ADLs? Yes  Allergy: No Known Allergies  Past Medical History  Diagnosis Date  . Hypertension 12/18/2011  . Hyperlipidemia 12/18/2011    History reviewed. No pertinent past surgical history.  Social History:  reports that he has quit smoking. He does not have any smokeless tobacco history on file. He reports that he drinks alcohol. He reports that he does  not use illicit drugs.  Family History:  Family History  Problem Relation Age of Onset  . Hypertension Mother   . Alcoholism Father      Prior to Admission medications   Medication Sig Start Date End Date Taking? Authorizing Provider  carvedilol (COREG) 6.25 MG tablet Take 1 tablet (6.25 mg total) by mouth 2 (two) times daily with a meal. 12/08/13  Yes Wanda Plump, MD  losartan (COZAAR) 100 MG tablet Take 1 tablet (100 mg total) by mouth daily. 12/08/13  Yes Wanda Plump, MD  Alum & Mag Hydroxide-Simeth (MAGIC MOUTHWASH W/LIDOCAINE) SOLN Take 5 mLs by mouth 4 (four) times daily as needed for mouth pain. Take 5 ml by mouth swish and spit four times daily Patient not taking: Reported on 02/18/2014 10/18/13   Bayard Beaver Saguier, PA-C    Physical Exam: Filed Vitals:   02/18/14 1811 02/18/14 1815 02/18/14 1900 02/18/14 1930  BP: 148/101 154/96    Pulse: 22 96 101 96  Temp: 99 F (37.2 C)     TempSrc: Oral     Resp: 20 20    Height:      Weight:      SpO2: 100% 100% 100% 100%   General: Not in acute distress HEENT:       Eyes: PERRL, EOMI, no scleral icterus       ENT: No discharge from the ears and nose, no pharynx injection, no tonsillar enlargement.        Neck: No JVD, no bruit, no  mass felt. Cardiac: S1/S2, RRR, No murmurs, No gallops or rubs Pulm: Good air movement bilaterally. Clear to auscultation bilaterally. No rales, wheezing, rhonchi or rubs. Abd: Soft, nondistended, nontender, no rebound pain, no organomegaly, BS present Ext: No edema bilaterally. 2+DP/PT pulse bilaterally Musculoskeletal: No joint deformities, erythema, or stiffness, ROM full Skin: No rashes.  Neuro: Alert and oriented X3, cranial nerves II-XII grossly intact, muscle strength 5/5 in all extremeties, sensation to light touch intact. Brachial reflex 1+ bilaterally. Knee reflex 4+ on the left side and 1+ on the right. Negative Babinski's sign. Normal finger to nose test. Psych: Patient is not psychotic, no  suicidal or hemocidal ideation.  Labs on Admission:  Basic Metabolic Panel:  Recent Labs Lab 02/18/14 1740  NA 142  K 3.9  CL 101  CO2 24  GLUCOSE 115*  BUN 14  CREATININE 1.37*  CALCIUM 9.0   Liver Function Tests:  Recent Labs Lab 02/18/14 1740  AST 66*  ALT 57*  ALKPHOS 52  BILITOT 0.7  PROT 7.4  ALBUMIN 3.8   No results for input(s): LIPASE, AMYLASE in the last 168 hours. No results for input(s): AMMONIA in the last 168 hours. CBC:  Recent Labs Lab 02/18/14 1740  WBC 9.0  HGB 14.4  HCT 42.8  MCV 101.4*  PLT 295   Cardiac Enzymes: No results for input(s): CKTOTAL, CKMB, CKMBINDEX, TROPONINI in the last 168 hours.  BNP (last 3 results) No results for input(s): PROBNP in the last 8760 hours. CBG: No results for input(s): GLUCAP in the last 168 hours.  Radiological Exams on Admission: Ct Head Wo Contrast  02/18/2014   CLINICAL DATA:  Acute dizziness, blurred vision, headache and syncope  EXAM: CT HEAD WITHOUT CONTRAST  TECHNIQUE: Contiguous axial images were obtained from the base of the skull through the vertex without contrast.  COMPARISON:  01/29/2012  FINDINGS: Normal appearance of the intracranial structures. No evidence for acute hemorrhage, mass lesion, midline shift, hydrocephalus or large infarct. No acute bony abnormality. The visualized sinuses are clear.  IMPRESSION: No acute intracranial abnormality.   Electronically Signed   By: Ruel Favorsrevor  Shick M.D.   On: 02/18/2014 18:49    EKG: Independently reviewed.   Assessment/Plan Principal Problem:   Acute encephalopathy Active Problems:   Hypertension   Hyperlipidemia   AKI (acute kidney injury)  Acute encephalopathy: He has acute altered mental status, blurry vison and elevated blood pressure. The differential diagnoses include TIA/stroke, hypertensive encephalopathy, press syndrome, alcohol abuse, drug abuse. Currently his mental status is normal. CT head is negative for acute abnormalities.  Physical examination showed increased knee reflex on the left side, indicating possible upper neuron problem, such as stroke. His blood pressure improved from 170/113 to 154/96.  - will admit to tele bed  - Obtain MRI - will hold losartan and continue Coreg. Will try not to drop his blood pressure too fast (about 25% in 6 hours). - start ASA  - fasting lipid panel, TSH and HbA1c  - check UDS and alcohol level - NPO until SLP done  AKI: Likely due to elevated blood pressure, or prerenal failure -FeNa -renal US -hold Losartan  HLD: No medications at home -Check FLP  Alcohol abuse: Patient does not have anemia, but MCV 101, indicating chronic regular drinking. -CIWA protocol -Discussed with pt about the importance of cutting down his drinking, patient would like to consider it seriously.   DVT ppx: SQ Heparin   Code Status: Full code Family Communication:   Yes,  patient's mother  at bed side Disposition Plan: Admit to inpatient   Date of Service 02/18/2014    Lorretta HarpIU, Deryn Massengale [facility] Pager [phone]  If 7PM-7AM, please contact night-coverage [email or web address] Password [email or web address] 02/18/2014, 7:48 PM

## 2014-02-18 NOTE — ED Notes (Signed)
Pt vomiting, provided emesis bag, small amt of emesis in bag, pt remains alert & following commands

## 2014-02-18 NOTE — ED Provider Notes (Signed)
CSN: [id number]     Arrival date & time 02/18/14  1636 History   First MD Initiated Contact with Patient 02/18/14 1658     Chief Complaint  Patient presents with  . Near Syncope   the patient is a 43 year old male, he has a history of hypertension and hyperlipidemia, he was recently on vacation through which time he did not take his blood pressure medications, when he awoke this morning he stated that his vision was mildly blurry and it has been that way all day. It was reported that the patient had a abnormal event at work where he felt as though he could not remember a period of time after walking to the bathroom he was found on the floor in the hallway and a coworker asking him if he was okay. He does not know how he got there, it was reported that he had slurred speech, some gait changes and some facial droop however the patient does not remember any of this. On arrival the patient was found to be hypertensive, he denies any abnormal urination, diarrhea, fevers, chills, nausea or vomiting though he does endorse having a GI illness several days ago with some vomiting which resolved spontaneously. He also reports having abnormal liver and renal function test as measured at his doctor's office in August during which time they switched his medications and he is now currently supposed to be taking carvedilol and losartan. The patient has not been back to the doctor in the last 3 months.  He denies polyuria, polydipsia, headache, chest pain, shortness of breath. He does endorse having palpitations this afternoon and at this time has ongoing visual changes as well.   (Consider location/radiation/quality/duration/timing/severity/associated sxs/prior Treatment) HPI  Past Medical History  Diagnosis Date  . Hypertension 12/18/2011  . Hyperlipidemia 12/18/2011   History reviewed. No pertinent past surgical history. No family history on file. History  Substance Use Topics  . Smoking status: Former Games developer   . Smokeless tobacco: Not on file     Comment: d/c cigars 2013  . Alcohol Use: Yes     Comment: socially     Review of Systems  All other systems reviewed and are negative.     Allergies  Review of patient's allergies indicates no known allergies.  Home Medications   Prior to Admission medications   Medication Sig Start Date End Date Taking? Authorizing Provider  carvedilol (COREG) 6.25 MG tablet Take 1 tablet (6.25 mg total) by mouth 2 (two) times daily with a meal. 12/08/13  Yes Wanda Plump, MD  losartan (COZAAR) 100 MG tablet Take 1 tablet (100 mg total) by mouth daily. 12/08/13  Yes Wanda Plump, MD  Alum & Mag Hydroxide-Simeth (MAGIC MOUTHWASH W/LIDOCAINE) SOLN Take 5 mLs by mouth 4 (four) times daily as needed for mouth pain. Take 5 ml by mouth swish and spit four times daily Patient not taking: Reported on 02/18/2014 10/18/13   Bayard Beaver Saguier, PA-C   BP 154/96 mmHg  Pulse 96  Temp(Src) 99 F (37.2 C) (Oral)  Resp 20  Ht 6\' 4"  (1.93 m)  Wt 240 lb (108.863 kg)  BMI 29.23 kg/m2  SpO2 100% Physical Exam  Constitutional: He appears well-developed and well-nourished.  Anxious appearing  HENT:  Head: Normocephalic and atraumatic.  Mouth/Throat: Oropharynx is clear and moist. No oropharyngeal exudate.  Eyes: Conjunctivae and EOM are normal. Pupils are equal, round, and reactive to light. Right eye exhibits no discharge. Left eye exhibits no discharge. No scleral  icterus.  Neck: Normal range of motion. Neck supple. No JVD present. No thyromegaly present.  Cardiovascular: Normal rate, regular rhythm, normal heart sounds and intact distal pulses.  Exam reveals no gallop and no friction rub.   No murmur heard. Pulmonary/Chest: Effort normal and breath sounds normal. No respiratory distress. He has no wheezes. He has no rales.  Abdominal: Soft. Bowel sounds are normal. He exhibits no distension and no mass. There is no tenderness.  Musculoskeletal: Normal range of motion. He  exhibits no edema or tenderness.  Lymphadenopathy:    He has no cervical adenopathy.  Neurological: He is alert. Coordination normal.  Speech is clear, cranial nerves III through XII are normal, memory loss to the events of the afternoon, unable to tell me his birthdate accurately, moves all 4 extremities without difficulty, normal strength, normal sensation, normal coordination, normal finger-nose-finger, no pronator drift  Skin: Skin is warm and dry. No rash noted. No erythema.  Psychiatric: He has a normal mood and affect. His behavior is normal.  Mildly tearful, anxious appearing  Nursing note and vitals reviewed.   ED Course  Procedures (including critical care time) Labs Review Labs Reviewed  CBC - Abnormal; Notable for the following:    MCV 101.4 (*)    MCH 34.1 (*)    All other components within normal limits  COMPREHENSIVE METABOLIC PANEL - Abnormal; Notable for the following:    Glucose, Bld 115 (*)    Creatinine, Ser 1.37 (*)    AST 66 (*)    ALT 57 (*)    GFR calc non Af Amer 62 (*)    GFR calc Af Amer 72 (*)    Anion gap 17 (*)    All other components within normal limits  URINALYSIS, ROUTINE W REFLEX MICROSCOPIC    Imaging Review Ct Head Wo Contrast  02/18/2014   CLINICAL DATA:  Acute dizziness, blurred vision, headache and syncope  EXAM: CT HEAD WITHOUT CONTRAST  TECHNIQUE: Contiguous axial images were obtained from the base of the skull through the vertex without contrast.  COMPARISON:  01/29/2012  FINDINGS: Normal appearance of the intracranial structures. No evidence for acute hemorrhage, mass lesion, midline shift, hydrocephalus or large infarct. No acute bony abnormality. The visualized sinuses are clear.  IMPRESSION: No acute intracranial abnormality.   Electronically Signed   By: Ruel Favorsrevor  Shick M.D.   On: 02/18/2014 18:49    ED ECG REPORT  I personally interpreted this EKG   Date: 02/18/2014   Rate: 97  Rhythm: normal sinus rhythm  QRS Axis: normal   Intervals: normal  ST/T Wave abnormalities: normal  Conduction Disutrbances:none  Narrative Interpretation:   Old EKG Reviewed: none available   MDM   Final diagnoses:  Syncope  PRES (posterior reversible encephalopathy syndrome)    The patient has ongoing hypertension, measured at 170/113, other vital signs are normal. The etiology of the patient's likely syncopal event is unclear. She does have some reproducible tenderness to his right lower back over the right lower ribs and musculature. We'll obtain imaging to rule out fracture, CT scan of the head as there is a question of whether the patient has had a temporal tumor in the past. This may have been soft tissue, the patient is unclear. He still is not back to baseline with mental status as he is unable to tell me his birthdate.  The patient has ongoing mild confusion, and has ongoing hypertension. His CT scan of the head was negative, his creatinine is  slightly worsened at 1.37 and his liver function tests are mildly elevated. He has been told this in the past. He has no facial droop, no focal neurologic deficits other than his ongoing visual haziness.  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 The patient is in agreement with this plan. Family is supportive of these actions.  Visual Acuity off - 20/40 R, 20/70 L  D/w hospistalist - niu who will admit  Meds given in ED:  Medications  losartan (COZAAR) tablet 100 mg (not administered)  metoprolol (LOPRESSOR) injection 5 mg (not administered)  ketorolac (TORADOL) 30 MG/ML injection 30 mg (30 mg Intravenous Given 02/18/14 1740)  sodium chloride 0.9 % bolus 1,000 mL (0 mLs Intravenous Stopped 02/18/14 1911)  ondansetron (ZOFRAN) injection 4 mg (4 mg Intravenous Given 02/18/14 1902)    New Prescriptions   No medications on file          Vida RollerBrian D Shantina Chronister, MD 02/18/14 1932

## 2014-02-19 ENCOUNTER — Inpatient Hospital Stay (HOSPITAL_COMMUNITY): Payer: BC Managed Care – PPO

## 2014-02-19 DIAGNOSIS — I10 Essential (primary) hypertension: Secondary | ICD-10-CM

## 2014-02-19 DIAGNOSIS — E785 Hyperlipidemia, unspecified: Secondary | ICD-10-CM

## 2014-02-19 DIAGNOSIS — I6330 Cerebral infarction due to thrombosis of unspecified cerebral artery: Secondary | ICD-10-CM

## 2014-02-19 DIAGNOSIS — I517 Cardiomegaly: Secondary | ICD-10-CM

## 2014-02-19 DIAGNOSIS — N179 Acute kidney failure, unspecified: Secondary | ICD-10-CM

## 2014-02-19 LAB — LIPID PANEL
CHOL/HDL RATIO: 3.4 ratio
Cholesterol: 146 mg/dL (ref 0–200)
HDL: 43 mg/dL (ref >39)
LDL Cholesterol: UNDETERMINED mg/dL (ref 0–99)
Triglycerides: 517 mg/dL — ABNORMAL HIGH (ref <150)
VLDL: UNDETERMINED mg/dL (ref 0–40)

## 2014-02-19 LAB — RAPID URINE DRUG SCREEN, HOSP PERFORMED
Amphetamines: NOT DETECTED
BENZODIAZEPINES: NOT DETECTED
Barbiturates: NOT DETECTED
Cocaine: NOT DETECTED
OPIATES: NOT DETECTED
Tetrahydrocannabinol: NOT DETECTED

## 2014-02-19 LAB — HEMOGLOBIN A1C
HEMOGLOBIN A1C: 4.9 % (ref <5.7)
MEAN PLASMA GLUCOSE: 94 mg/dL (ref <117)

## 2014-02-19 LAB — CREATININE, URINE, RANDOM: Creatinine, Urine: 296.44 mg/dL

## 2014-02-19 LAB — TSH: TSH: 0.445 u[IU]/mL (ref 0.350–4.500)

## 2014-02-19 LAB — SODIUM, URINE, RANDOM: Sodium, Ur: 82 mEq/L

## 2014-02-19 MED ORDER — HYDRALAZINE HCL 20 MG/ML IJ SOLN
10.0000 mg | Freq: Four times a day (QID) | INTRAMUSCULAR | Status: DC | PRN
  Administered 2014-02-19: 10 mg via INTRAVENOUS
  Filled 2014-02-19: qty 1

## 2014-02-19 MED ORDER — ONDANSETRON HCL 4 MG/2ML IJ SOLN
4.0000 mg | Freq: Four times a day (QID) | INTRAMUSCULAR | Status: DC | PRN
  Administered 2014-02-19: 4 mg via INTRAVENOUS

## 2014-02-19 MED ORDER — ATORVASTATIN CALCIUM 80 MG PO TABS
80.0000 mg | ORAL_TABLET | Freq: Every day | ORAL | Status: DC
  Administered 2014-02-19: 80 mg via ORAL
  Filled 2014-02-19 (×2): qty 1

## 2014-02-19 MED ORDER — GEMFIBROZIL 600 MG PO TABS
600.0000 mg | ORAL_TABLET | Freq: Two times a day (BID) | ORAL | Status: DC
  Administered 2014-02-20: 600 mg via ORAL
  Filled 2014-02-19 (×5): qty 1

## 2014-02-19 MED ORDER — LORAZEPAM 2 MG/ML IJ SOLN
INTRAMUSCULAR | Status: AC
  Filled 2014-02-19: qty 1

## 2014-02-19 NOTE — Plan of Care (Signed)
Problem: Phase II Progression Outcomes Goal: Progress activity as tolerated unless otherwise ordered Outcome: Completed/Met Date Met:  02/19/14 Goal: Discharge plan established Outcome: Completed/Met Date Met:  02/19/14 Goal: Obtain order to discontinue catheter if appropriate Outcome: Not Applicable Date Met:  58/25/18

## 2014-02-19 NOTE — Progress Notes (Signed)
  Echocardiogram 2D Echocardiogram has been performed.  Arvil ChacoFoster, Abron Neddo 02/19/2014, 2:34 PM

## 2014-02-19 NOTE — Plan of Care (Signed)
Problem: Phase I Progression Outcomes Goal: Pain controlled with appropriate interventions Outcome: Completed/Met Date Met:  02/19/14 Goal: OOB as tolerated unless otherwise ordered Outcome: Completed/Met Date Met:  02/19/14 Goal: Initial discharge plan identified Outcome: Completed/Met Date Met:  02/19/14 Goal: Voiding-avoid urinary catheter unless indicated Outcome: Completed/Met Date Met:  02/19/14 Goal: Hemodynamically stable Outcome: Completed/Met Date Met:  02/19/14 Goal: Other Phase I Outcomes/Goals Outcome: Completed/Met Date Met:  02/19/14

## 2014-02-19 NOTE — Progress Notes (Addendum)
[facility] PROGRESS NOTE  Lonia Chimeraric Teagle ZOX:[id number] DOB: [birth year] DOA: 02/18/2014 PCP: Willow OraJose Paz, MD Brief narrative 43 year old African-American male with history of heavy alcohol use, hypertension and hyperlipidemia presented with near syncope, altered mental status and bloody vision. Patient found to have uncontrolled hypertension on exam. 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   Assessment/Plan: Acute CVA Risk factors include uncontrolled hypertension and hyperlipidemia. No further neurological deficits. Continue full dose aspirin, added Lipitor 80 mg daily. (Monitor LFTs) Allow permissive blood pressure. Full stroke workup including MRA head, 2-D echo and carotid Dopplers Lipid panel shows significant hypertriglyceridemia, check A1c Discussed on secondary stroke prevention. Appreciate neurology evaluation. PT/OT eval  Uncontrolled hypertension Continue coreg bid. While permissive blood pressure  Alcohol abuse Patient reports heavy drinking on a daily basis for almost 20 years. (Drinks about a pint of wine and a few hard liquor everyday) Denies history of withdrawal symptoms. Monitor on CIWA. Continue thiamine, folate and multivitamin Counseled strongly on alcohol cessation. Offered social work eval with outpatient resources but patient declined.  Acute kidney injury Mild. Possibly secondary to dehydration versus hypertensive nephropathy. UA and renal ultrasound unremarkable. Monitor.  Transaminitis Likely secondary to fatty liver. Monitor clinically. If not improved with check liver ultrasound  Hyperlipidemia Patient has significant triglyceridemia. Placed on Lipitor 80 mg daily. Add gemfibrozil. DVT prophylaxis: Subcutaneous heparin  Diet: Heart healthy  Code Status: Code Family Communication: Wife at bedside Disposition Plan: Home once workup completed. Likely  tomorrow   Consultants:  Etiology  Procedures:  MRI brain  Antibiotics:  None  HPI/Subjective: Patient seen and examined. Complains of some nausea but no other symptoms.  Objective: Filed Vitals:   02/19/14 0549  BP: 152/95  Pulse: 80  Temp: 97.9 F (36.6 C)  Resp: 16    Intake/Output Summary (Last 24 hours) at 02/19/14 1239 Last data filed at 02/19/14 0700  Gross per 24 hour  Intake    120 ml  Output      0 ml  Net    120 ml   Filed Weights   02/18/14 1650 02/18/14 2039  Weight: 108.863 kg (240 lb) 113.535 kg (250 lb 4.8 oz)    Exam:   General:  Middle aged male in no acute distress  HEENT: No pallor, moist oral mucosa and slight chest: Clear to auscultation bilaterally  WJX:BJYNWGCVS:Normal S1 and S2, no murmurs  Abdomen: Soft, nondistended, nontender  Extremities: Warm, no edema  CNS: AAO X3, normal power and sensation bilaterally, nonfocal    Data Reviewed: Basic Metabolic Panel:  Recent Labs Lab 02/18/14 1740  NA 142  K 3.9  CL 101  CO2 24  GLUCOSE 115*  BUN 14  CREATININE 1.37*  CALCIUM 9.0   Liver Function Tests:  Recent Labs Lab 02/18/14 1740  AST 66*  ALT 57*  ALKPHOS 52  BILITOT 0.7  PROT 7.4  ALBUMIN 3.8   No results for input(s): LIPASE, AMYLASE in the last 168 hours. No results for input(s): AMMONIA in the last 168 hours. CBC:  Recent Labs Lab 02/18/14 1740  WBC 9.0  HGB 14.4  HCT 42.8  MCV 101.4*  PLT 295   Cardiac Enzymes: No results for input(s): CKTOTAL, CKMB, CKMBINDEX, TROPONINI in the last 168 hours. BNP (last 3 results) No results for input(s): PROBNP in the last 8760 hours. CBG: No results for input(s): GLUCAP in the last 168 hours.  No results found for this or any previous visit (from  the past 240 hour(s)).   Studies: Ct Head Wo Contrast  02/18/2014   CLINICAL DATA:  Acute dizziness, blurred vision, headache and syncope  EXAM: CT HEAD WITHOUT CONTRAST  TECHNIQUE: Contiguous axial images were  obtained from the base of the skull through the vertex without contrast.  COMPARISON:  01/29/2012  FINDINGS: Normal appearance of the intracranial structures. No evidence for acute hemorrhage, mass lesion, midline shift, hydrocephalus or large infarct. No acute bony abnormality. The visualized sinuses are clear.  IMPRESSION: No acute intracranial abnormality.   Electronically Signed   By: Ruel Favorsrevor  Shick M.D.   On: 02/18/2014 18:49   Mr Brain Wo Contrast  02/19/2014   CLINICAL DATA:  Blurred vision and acute dizziness beginning 02/18/2014. Altered mental status. Acute encephalopathy. Elevated blood alcohol. Elevated blood pressure on admission. Initial encounter.  EXAM: MRI HEAD WITHOUT CONTRAST  TECHNIQUE: Multiplanar, multiecho pulse sequences of the brain and surrounding structures were obtained without intravenous contrast.  COMPARISON:  CT head 02/18/2014.  FINDINGS: Tiny focus of cortical restricted diffusion in a LEFT posterior parietal parasagittal gyrus is confirmed on axial and coronal DWI. No other areas of restricted diffusion is seen.  No hemorrhage, mass lesion, hydrocephalus, or extra-axial fluid.  Premature for age cerebral and cerebellar atrophy. Prominent perivascular spaces could reflect chronic hypertension. Mild subcortical and periventricular T2 and FLAIR hyperintensities, likely chronic microvascular ischemic change. Pituitary, pineal, and cerebellar tonsils unremarkable. No upper cervical lesions. Flow voids are maintained throughout the carotid, basilar, and vertebral arteries. There are no areas of chronic hemorrhage. Visualized calvarium, skull base, and upper cervical osseous structures unremarkable. Scalp and extracranial soft tissues, orbits, sinuses, and mastoids show no acute process.  IMPRESSION: Tiny focus of cortical restricted diffusion in a LEFT posterior parietal parasagittal gyrus consistent with small acute infarct. No posterior circulation vasogenic edema to suggest PRES.   Premature for age cerebral and cerebellar atrophy could reflect chronic ETOH abuse.  Mild subcortical and periventricular T2 and FLAIR hyperintensities, likely chronic microvascular ischemic change.   Electronically Signed   By: Davonna BellingJohn  Curnes M.D.   On: 02/19/2014 09:31   [place] Renal  02/19/2014   CLINICAL DATA:  Acute onset of renal insufficiency. Initial encounter.  EXAM: RENAL/URINARY TRACT ULTRASOUND COMPLETE  COMPARISON:  None.  FINDINGS: Right Kidney:  Length: 10.9 cm. Echogenicity within normal limits. No mass or hydronephrosis visualized.  Left Kidney:  Length: 11.9 cm. Echogenicity within normal limits. No mass or hydronephrosis visualized.  Bladder:  Appears normal for degree of bladder distention.  IMPRESSION: Unremarkable renal ultrasound.   Electronically Signed   By: Roanna RaiderJeffery  Chang M.D.   On: 02/19/2014 07:05    Scheduled Meds: . aspirin  300 mg Rectal Daily   Or  . aspirin  325 mg Oral Daily  . atorvastatin  80 mg Oral q1800  . folic acid  1 mg Oral Daily  . heparin  5,000 Units Subcutaneous 3 times per day  . LORazepam      . LORazepam  0-4 mg Intravenous Q6H   Followed by  . [START ON 02/20/2014] LORazepam  0-4 mg Intravenous Q12H  . multivitamin with minerals  1 tablet Oral Daily  . thiamine  100 mg Oral Daily   Or  . thiamine  100 mg Intravenous Daily  . traMADol  50 mg Oral Once   Continuous Infusions:     Time spent: 25 minutes    Eddie NorthDHUNGEL, Riven Mabile  [facility] Pager [phone] If 7PM-7AM, please contact night-coverage at [email or web address], password  [email or web address] 02/19/2014, 12:39 PM  LOS: 1 day

## 2014-02-19 NOTE — Progress Notes (Signed)
Notified MD about pt's BP continuing to be in 170's systolic after prn hydralazine. Will continue to monitor.

## 2014-02-19 NOTE — Consult Note (Addendum)
Stroke Consult    Chief Complaint: dizziness and confusion HPI: Tony Walker is an 43 y.o. male he has a history of hypertension and hyperlipidemia, he was recently on vacation through which time he did not take his blood pressure medications, when he awoke this morning he stated that his vision was mildly blurry and it has been that way all day. It was reported that the patient had a abnormal event at work where he felt as though he could not remember a period of time after walking to the bathroom he was found on the floor in the hallway and a coworker asking him if he was okay. He does not know how he got there, it was reported that he had slurred speech, some gait changes and some facial droop however the patient does not remember any of this. On arrival the patient was found to be hypertensive but otherwise back to baseline.   SBP in ED initially noted to be 170. EtOH level found to be 240 in the ED. Creatinine 1.37, AST and ALT both elevated. MRI brain images reviewed and shows a tiny cortical infarct in the left posterior parietal parasagittal gyrus.   Date last known well: 02/17/2014 Time last known well: 2200 tPA Given: no, mildness of symptoms, outside the window  Past Medical History  Diagnosis Date  . Hypertension 12/18/2011  . Hyperlipidemia 12/18/2011    History reviewed. No pertinent past surgical history.  Family History  Problem Relation Age of Onset  . Hypertension Mother   . Alcoholism Father    Social History:  reports that he has quit smoking. He does not have any smokeless tobacco history on file. He reports that he drinks alcohol. He reports that he does not use illicit drugs.  Allergies: No Known Allergies  Medications Prior to Admission  Medication Sig Dispense Refill  . carvedilol (COREG) 6.25 MG tablet Take 1 tablet (6.25 mg total) by mouth 2 (two) times daily with a meal. 60 tablet 4  . losartan (COZAAR) 100 MG tablet Take 1 tablet (100 mg total) by mouth daily.  30 tablet 4  . Alum & Mag Hydroxide-Simeth (MAGIC MOUTHWASH W/LIDOCAINE) SOLN Take 5 mLs by mouth 4 (four) times daily as needed for mouth pain. Take 5 ml by mouth swish and spit four times daily (Patient not taking: Reported on 02/18/2014) 200 mL 0    ROS: Out of a complete 14 system review, the patient complains of only the following symptoms, and all other reviewed systems are negative. +headache, fatigue  Physical Examination: Filed Vitals:   02/19/14 0549  BP: 152/95  Pulse: 80  Temp: 97.9 F (36.6 C)  Resp: 16   Physical Exam  Constitutional: He appears well-developed and well-nourished.  Psych: Affect appropriate to situation Eyes: No scleral injection HENT: No OP obstrucion Head: Normocephalic.  Cardiovascular: Normal rate and regular rhythm.  Respiratory: Effort normal and breath sounds normal.  GI: Soft. Bowel sounds are normal. No distension. There is no tenderness.  Skin: WDI  Neurologic Examination: Mental Status: Alert, oriented, thought content appropriate.  Speech fluent without evidence of aphasia.  Able to follow 3 step commands without difficulty. Cranial Nerves: II: funduscopic exam wnl bilaterally, visual fields grossly normal, pupils equal, round, reactive to light and accommodation III,IV, VI: ptosis not present, extra-ocular motions intact bilaterally V,VII: smile symmetric, facial light touch sensation normal bilaterally VIII: hearing normal bilaterally IX,X: gag reflex present XI: trapezius strength/neck flexion strength normal bilaterally XII: tongue strength normal  Motor: Right :  Upper extremity    Left:     Upper extremity 5/5 deltoid       5/5 deltoid 5/5 biceps      5/5 biceps  5/5 triceps      5/5 triceps 5/5 hand grip      5/5 hand grip  Lower extremity     Lower extremity 5/5 hip flexor      5/5 hip flexor 5/5 quadricep      5/5 quadriceps  5/5 hamstrings     5/5 hamstrings 5/5 plantar flexion       5/5 plantar flexion 5/5  plantar extension     5/5 plantar extension Tone and bulk:normal tone throughout; no atrophy noted Sensory: Pinprick and light touch intact throughout, bilaterally Deep Tendon Reflexes: 2+ and symmetric throughout Plantars: Right: downgoing   Left: downgoing Cerebellar: normal finger-to-nose, normal rapid alternating movements and normal heel-to-shin test Gait: deferred due to multiple leads on Laboratory Studies:   Basic Metabolic Panel:  Recent Labs Lab 02/18/14 1740  NA 142  K 3.9  CL 101  CO2 24  GLUCOSE 115*  BUN 14  CREATININE 1.37*  CALCIUM 9.0    Liver Function Tests:  Recent Labs Lab 02/18/14 1740  AST 66*  ALT 57*  ALKPHOS 52  BILITOT 0.7  PROT 7.4  ALBUMIN 3.8   No results for input(s): LIPASE, AMYLASE in the last 168 hours. No results for input(s): AMMONIA in the last 168 hours.  CBC:  Recent Labs Lab 02/18/14 1740  WBC 9.0  HGB 14.4  HCT 42.8  MCV 101.4*  PLT 295    Cardiac Enzymes: No results for input(s): CKTOTAL, CKMB, CKMBINDEX, TROPONINI in the last 168 hours.  BNP: Invalid input(s): POCBNP  CBG: No results for input(s): GLUCAP in the last 168 hours.  Microbiology: No results found for this or any previous visit.  Coagulation Studies: No results for input(s): LABPROT, INR in the last 72 hours.  Urinalysis:  Recent Labs Lab 02/18/14 1814  COLORURINE YELLOW  LABSPEC 1.016  PHURINE 5.5  GLUCOSEU NEGATIVE  HGBUR NEGATIVE  BILIRUBINUR NEGATIVE  KETONESUR NEGATIVE  PROTEINUR NEGATIVE  UROBILINOGEN 0.2  NITRITE NEGATIVE  LEUKOCYTESUR NEGATIVE    Lipid Panel:     Component Value Date/Time   CHOL 146 02/19/2014 0428   TRIG 517* 02/19/2014 0428   HDL 43 02/19/2014 0428   CHOLHDL 3.4 02/19/2014 0428   VLDL UNABLE TO CALCULATE IF TRIGLYCERIDE OVER 400 mg/dL 16/12/9602 5409   LDLCALC UNABLE TO CALCULATE IF TRIGLYCERIDE OVER 400 mg/dL 81/19/1478 2956    OZHY8M: No results found for: HGBA1C  Urine Drug Screen:  No  results found for: LABOPIA, COCAINSCRNUR, LABBENZ, AMPHETMU, THCU, LABBARB  Alcohol Level:  Recent Labs Lab 02/18/14 2217  ETH 240*    Other results:  Imaging: Ct Head Wo Contrast  02/18/2014   CLINICAL DATA:  Acute dizziness, blurred vision, headache and syncope  EXAM: CT HEAD WITHOUT CONTRAST  TECHNIQUE: Contiguous axial images were obtained from the base of the skull through the vertex without contrast.  COMPARISON:  01/29/2012  FINDINGS: Normal appearance of the intracranial structures. No evidence for acute hemorrhage, mass lesion, midline shift, hydrocephalus or large infarct. No acute bony abnormality. The visualized sinuses are clear.  IMPRESSION: No acute intracranial abnormality.   Electronically Signed   By: Ruel Favors M.D.   On: 02/18/2014 18:49   Mr Brain Wo Contrast  02/19/2014   CLINICAL DATA:  Blurred vision and acute dizziness beginning 02/18/2014. Altered  mental status. Acute encephalopathy. Elevated blood alcohol. Elevated blood pressure on admission. Initial encounter.  EXAM: MRI HEAD WITHOUT CONTRAST  TECHNIQUE: Multiplanar, multiecho pulse sequences of the brain and surrounding structures were obtained without intravenous contrast.  COMPARISON:  CT head 02/18/2014.  FINDINGS: Tiny focus of cortical restricted diffusion in a LEFT posterior parietal parasagittal gyrus is confirmed on axial and coronal DWI. No other areas of restricted diffusion is seen.  No hemorrhage, mass lesion, hydrocephalus, or extra-axial fluid.  Premature for age cerebral and cerebellar atrophy. Prominent perivascular spaces could reflect chronic hypertension. Mild subcortical and periventricular T2 and FLAIR hyperintensities, likely chronic microvascular ischemic change. Pituitary, pineal, and cerebellar tonsils unremarkable. No upper cervical lesions. Flow voids are maintained throughout the carotid, basilar, and vertebral arteries. There are no areas of chronic hemorrhage. Visualized calvarium, skull  base, and upper cervical osseous structures unremarkable. Scalp and extracranial soft tissues, orbits, sinuses, and mastoids show no acute process.  IMPRESSION: Tiny focus of cortical restricted diffusion in a LEFT posterior parietal parasagittal gyrus consistent with small acute infarct. No posterior circulation vasogenic edema to suggest PRES.  Premature for age cerebral and cerebellar atrophy could reflect chronic ETOH abuse.  Mild subcortical and periventricular T2 and FLAIR hyperintensities, likely chronic microvascular ischemic change.   Electronically Signed   By: Davonna BellingJohn  Curnes M.D.   On: 02/19/2014 09:31   [place] Renal  02/19/2014   CLINICAL DATA:  Acute onset of renal insufficiency. Initial encounter.  EXAM: RENAL/URINARY TRACT ULTRASOUND COMPLETE  COMPARISON:  None.  FINDINGS: Right Kidney:  Length: 10.9 cm. Echogenicity within normal limits. No mass or hydronephrosis visualized.  Left Kidney:  Length: 11.9 cm. Echogenicity within normal limits. No mass or hydronephrosis visualized.  Bladder:  Appears normal for degree of bladder distention.  IMPRESSION: Unremarkable renal ultrasound.   Electronically Signed   By: Roanna RaiderJeffery  Chang M.D.   On: 02/19/2014 07:05    Assessment: 43 y.o. male presenting with blurry vision, confusion, gait instability found to have small acute infarct on MRI. Of note was hypertensive with EtOH of 240 and Cr of 1.37. Suspect etiology of his symptoms are likely multifactorial and not related to just the ischemic infarct. No family history of prior clots or strokes at young age. May consider checking hypercoagulable workup.   Stroke Risk Factors - HTN, HLD  Plan: 1. HgbA1c, fasting lipid panel 2. MRI, MRA  of the brain without contrast 3. PT consult, OT consult, Speech consult 4. Echocardiogram 5. Carotid dopplers 6. Prophylactic therapy-ASA 81mg  daily 7. Risk factor modification 8. Telemetry monitoring 9. Frequent neuro checks 10. NPO until RN stroke swallow  screen    Elspeth Choeter Gabriellah Rabel, DO [facility] [phone]  If 7pm- 7am, please page neurology on call as listed in AMION. 02/19/2014, 10:56 AM

## 2014-02-20 DIAGNOSIS — R55 Syncope and collapse: Secondary | ICD-10-CM | POA: Diagnosis present

## 2014-02-20 DIAGNOSIS — R740 Nonspecific elevation of levels of transaminase and lactic acid dehydrogenase [LDH]: Secondary | ICD-10-CM

## 2014-02-20 DIAGNOSIS — R7401 Elevation of levels of liver transaminase levels: Secondary | ICD-10-CM | POA: Diagnosis present

## 2014-02-20 DIAGNOSIS — I639 Cerebral infarction, unspecified: Principal | ICD-10-CM

## 2014-02-20 DIAGNOSIS — I674 Hypertensive encephalopathy: Secondary | ICD-10-CM

## 2014-02-20 DIAGNOSIS — I160 Hypertensive urgency: Secondary | ICD-10-CM | POA: Diagnosis present

## 2014-02-20 DIAGNOSIS — Z8673 Personal history of transient ischemic attack (TIA), and cerebral infarction without residual deficits: Secondary | ICD-10-CM | POA: Insufficient documentation

## 2014-02-20 DIAGNOSIS — F1010 Alcohol abuse, uncomplicated: Secondary | ICD-10-CM | POA: Diagnosis present

## 2014-02-20 LAB — COMPREHENSIVE METABOLIC PANEL
ALT: 66 U/L — ABNORMAL HIGH (ref 0–53)
AST: 160 U/L — AB (ref 0–37)
Albumin: 3.3 g/dL — ABNORMAL LOW (ref 3.5–5.2)
Alkaline Phosphatase: 51 U/L (ref 39–117)
Anion gap: 12 (ref 5–15)
BILIRUBIN TOTAL: 1.9 mg/dL — AB (ref 0.3–1.2)
BUN: 16 mg/dL (ref 6–23)
CHLORIDE: 101 meq/L (ref 96–112)
CO2: 26 meq/L (ref 19–32)
Calcium: 8.8 mg/dL (ref 8.4–10.5)
Creatinine, Ser: 1.68 mg/dL — ABNORMAL HIGH (ref 0.50–1.35)
GFR calc Af Amer: 56 mL/min — ABNORMAL LOW (ref >90)
GFR, EST NON AFRICAN AMERICAN: 48 mL/min — AB (ref >90)
Glucose, Bld: 92 mg/dL (ref 70–99)
Potassium: 3.7 mEq/L (ref 3.7–5.3)
Sodium: 139 mEq/L (ref 137–147)
Total Protein: 6.2 g/dL (ref 6.0–8.3)

## 2014-02-20 MED ORDER — CARVEDILOL 6.25 MG PO TABS
6.2500 mg | ORAL_TABLET | Freq: Two times a day (BID) | ORAL | Status: DC
  Filled 2014-02-20 (×2): qty 1

## 2014-02-20 MED ORDER — HYDRALAZINE HCL 25 MG PO TABS: 25.0000 mg | ORAL_TABLET | Freq: Three times a day (TID) | ORAL | Status: DC

## 2014-02-20 MED ORDER — ASPIRIN EC 81 MG PO TBEC: 81.0000 mg | DELAYED_RELEASE_TABLET | Freq: Every day | ORAL | Status: AC

## 2014-02-20 MED ORDER — AMLODIPINE BESYLATE 10 MG PO TABS
10.0000 mg | ORAL_TABLET | Freq: Every day | ORAL | Status: DC
  Administered 2014-02-20: 10 mg via ORAL
  Filled 2014-02-20: qty 1

## 2014-02-20 MED ORDER — CARVEDILOL 3.125 MG PO TABS
3.1250 mg | ORAL_TABLET | Freq: Two times a day (BID) | ORAL | Status: DC
  Filled 2014-02-20 (×2): qty 1

## 2014-02-20 MED ORDER — GEMFIBROZIL 600 MG PO TABS: 600.0000 mg | ORAL_TABLET | Freq: Two times a day (BID) | ORAL | Status: DC

## 2014-02-20 MED ORDER — HYDRALAZINE HCL 25 MG PO TABS
25.0000 mg | ORAL_TABLET | Freq: Three times a day (TID) | ORAL | Status: DC
  Administered 2014-02-20: 25 mg via ORAL
  Filled 2014-02-20 (×3): qty 1

## 2014-02-20 MED ORDER — CARVEDILOL 12.5 MG PO TABS: 12.5000 mg | ORAL_TABLET | Freq: Two times a day (BID) | ORAL | Status: DC

## 2014-02-20 MED ORDER — LOSARTAN POTASSIUM 50 MG PO TABS
100.0000 mg | ORAL_TABLET | Freq: Every day | ORAL | Status: DC
  Administered 2014-02-20: 100 mg via ORAL
  Filled 2014-02-20: qty 2

## 2014-02-20 NOTE — Progress Notes (Signed)
Utilization Review Completed.Tony Walker T12/08/2013  

## 2014-02-20 NOTE — Discharge Summary (Signed)
Physician Discharge Summary  Tony Walker ZOX:[id number] DOB: 1971/01/17 DOA: 02/18/2014  PCP: Willow OraJose Paz, MD  Admit date: 02/18/2014 Discharge date: 02/20/2014  Time spent: 35 minutes  Recommendations for Outpatient Follow-up:  1. Please follow-up with PCP in next 2-3 days. Please check hepatitis panel sent on 12/6. Please check 2-D echo results. Patient will need to work to check liver enzymes and renal function. He will need outpatient referral for liver ultrasound. Please adjust blood pressure medications. 2. Follow-up with [facility] in 1-2 months  Discharge Diagnoses:  Principal Problem: Acute CVA  Active Problems:   Hypertension   Hyperlipidemia   AKI (acute kidney injury)   Acute encephalopathy   Transaminitis   Syncope   Encephalopathy, hypertensive   Hypertensive urgency   ETOH abuse   Dyslipidemia   Hypertriglyceridemia   Discharge Condition: Fair  Diet recommendation: Heart healthy  Filed Weights   02/18/14 1650 02/18/14 2039  Weight: 108.863 kg (240 lb) 113.535 kg (250 lb 4.8 oz)    History of present illness:  Please refer to admission H&P for details, but in brief, 43 year old African-American male with history of heavy alcohol use, hypertension, hyperlipidemia who presented with near syncope and confusion along with blurred vision. Patient was found to have severe uncontrolled hypertension on arrival. Head CT was remarkable but MRI of the brain showed small left posterior parietal infarct. Patient admitted for further workup. Workup on admission also showed acute kidney injury and mild transaminitis.   Hospital Course:  Acute CVA Risk factors include uncontrolled hypertension and hyperlipidemia. Urine drug screen was negative. Patient does not have further neurological deficit. Patient was placed on full dose aspirin and added Lipitor 80 mg daily. Patient had mild transaminitis on admission but today has worsened with AST of 160 and ALT of 66. Total  bilirubin was 1.9. Patient denies having any symptoms. -Lipitor has been held. I offered obtaining an abdominal ultrasound but patient refused and wanted to go home and follow-up with his PCP this week and have it done as an outpatient. -Given his hypertriglyceridemia I have placed him on gemfibrozil 600 milligrams twice daily. -Patient counseled on lifestyle modifications, monitoring his diet with salt restrictions, exercise and regular outpatient follow-up. Hemoglobin A1c was 4.9 -2-D echo has been done but results unavailable due to system error and should be followed up as outpatient. Carotid  Doppler without significant stenosis. -Patient will be discharged on baby aspirin. Not on statin due to transaminitis.  Uncontrolled severe hypertension Patient on carvedilol and Cozaar at home. I have increased his carvedilol to 12.5 mg twice daily, and added hydralazine 25 mg 3 times a day. Patient reports being intolerant to amlodipine. I have discontinued Cozaar due to acute kidney injury. Patient counseled on dietary modifications and medication adherence.  Alcohol abuse Patient reports heavy drinking on a daily basis for almost 20 years. (Drinks about a pint of wine and a few hard liquor everyday) Denies history of withdrawal symptoms. His alcohol level was 240 on admission . Monitor on CIWA. Continue thiamine, folate and multivitamin Counseled strongly on alcohol cessation. Offered social work eval with outpatient resources but patient declined.  Acute kidney injury Possibly secondary to dehydration versus hypertensive nephropathy. Patient also on ARB. UA and renal ultrasound unremarkable. Noted for worsened renal function today with creatinine of 1.68. Renal fn should be followed up as outpatient.  instructed to avoid NSAIDs.  Acute transaminitis Initially thought to be related to fatty liver with heavy alcohol use. However his LFTs have worsened  this a.m. Patient asymptomatic with benign  abdominal exam. Ordered hepatitis panel. His refused to stay for abdominal ultrasound and wanted to get it done as an outpatient. Recommend outpatient ultrasound abdomen and recheck LFTs during follow-up. Discontinued Lipitor that was started yesterday for the same reason.  Hypertriglyceridemia On gemfibrozil. Denies any family history. Started on Lipitor but discontinued due to transaminitis.   Patient is clinically stable. He does have some acute kidney injury and worsened transaminitis but does not want to stay longer in the hospital to complete workup. I think this can be done as an outpatient and patient promises to follow-up with his PCP within the next 3-4 days.  Procedures:  MRI brain  2-D echo  Carotid Doppler  Renal ultrasound  Consultations:  Neurology  Discharge Exam: Filed Vitals:   02/20/14 1335  BP: 152/91  Pulse: 81  Temp: 98.8 F (37.1 C)  Resp: 18    General: Middle aged male in no acute distress HEENT: No pallor, no icterus, moist oral mucosa Chest: Clear to auscultation bilaterally CVS: Normal S1 and S2, no murmurs Abdomen: Soft, nondistended, nontender, bowel sounds present Extremities: Warm, no edema CNS: Alert and oriented   Discharge Instructions You were cared for by a hospitalist during your hospital stay. If you have any questions about your discharge medications or the care you received while you were in the hospital after you are discharged, you can call the unit and asked to speak with the hospitalist on call if the hospitalist that took care of you is not available. Once you are discharged, your primary care physician will handle any further medical issues. Please note that NO REFILLS for any discharge medications will be authorized once you are discharged, as it is imperative that you return to your primary care physician (or establish a relationship with a primary care physician if you do not have one) for your aftercare needs so that they  can reassess your need for medications and monitor your lab values.   Current Discharge Medication List    START taking these medications   Details  aspirin EC 81 MG tablet Take 1 tablet (81 mg total) by mouth daily. Qty: 30 tablet, Refills: 0    gemfibrozil (LOPID) 600 MG tablet Take 1 tablet (600 mg total) by mouth 2 (two) times daily before a meal. Qty: 60 tablet, Refills: 0    hydrALAZINE (APRESOLINE) 25 MG tablet Take 1 tablet (25 mg total) by mouth every 8 (eight) hours. Qty: 90 tablet, Refills: 0      CONTINUE these medications which have CHANGED   Details  carvedilol (COREG) 12.5 MG tablet Take 1 tablet (12.5 mg total) by mouth 2 (two) times daily with a meal. Qty: 60 tablet, Refills: 0      CONTINUE these medications which have NOT CHANGED   Details  Alum & Mag Hydroxide-Simeth (MAGIC MOUTHWASH W/LIDOCAINE) SOLN Take 5 mLs by mouth 4 (four) times daily as needed for mouth pain. Take 5 ml by mouth swish and spit four times daily Qty: 200 mL, Refills: 0      STOP taking these medications     losartan (COZAAR) 100 MG tablet        No Known Allergies Follow-up Information    Follow up with Willow OraJose Paz, MD. Schedule an appointment as soon as possible for a visit in 1 week.   Specialty:  Internal Medicine   Contact information:   [address] [place] [id number] [phone]  The results of significant diagnostics from this hospitalization (including imaging, microbiology, ancillary and laboratory) are listed below for reference.    Significant Diagnostic Studies: Ct Head Wo Contrast  02/18/2014   CLINICAL DATA:  Acute dizziness, blurred vision, headache and syncope  EXAM: CT HEAD WITHOUT CONTRAST  TECHNIQUE: Contiguous axial images were obtained from the base of the skull through the vertex without contrast.  COMPARISON:  01/29/2012  FINDINGS: Normal appearance of the intracranial structures. No evidence for acute hemorrhage, mass lesion,  midline shift, hydrocephalus or large infarct. No acute bony abnormality. The visualized sinuses are clear.  IMPRESSION: No acute intracranial abnormality.   Electronically Signed   By: Ruel Favors M.D.   On: 02/18/2014 18:49   Mr Maxine Glenn Head Wo Contrast  02/19/2014   CLINICAL DATA:  Blurred vision and acute dizziness beginning 02/18/2014. Altered mental status. Acute encephalopathy. Elevated blood alcohol.  EXAM: MRA HEAD WITHOUT CONTRAST  TECHNIQUE: Angiographic images of the Circle of Willis were obtained using MRA technique without intravenous contrast.  COMPARISON:  MRI brain earlier today.  FINDINGS: Internal carotid arteries widely patent. Basilar artery widely patent. Vertebrals codominant. No intracranial stenosis or aneurysm.  IMPRESSION: Negative.   Electronically Signed   By: Davonna Belling M.D.   On: 02/19/2014 16:44   Mr Brain Wo Contrast  02/19/2014   CLINICAL DATA:  Blurred vision and acute dizziness beginning 02/18/2014. Altered mental status. Acute encephalopathy. Elevated blood alcohol. Elevated blood pressure on admission. Initial encounter.  EXAM: MRI HEAD WITHOUT CONTRAST  TECHNIQUE: Multiplanar, multiecho pulse sequences of the brain and surrounding structures were obtained without intravenous contrast.  COMPARISON:  CT head 02/18/2014.  FINDINGS: Tiny focus of cortical restricted diffusion in a LEFT posterior parietal parasagittal gyrus is confirmed on axial and coronal DWI. No other areas of restricted diffusion is seen.  No hemorrhage, mass lesion, hydrocephalus, or extra-axial fluid.  Premature for age cerebral and cerebellar atrophy. Prominent perivascular spaces could reflect chronic hypertension. Mild subcortical and periventricular T2 and FLAIR hyperintensities, likely chronic microvascular ischemic change. Pituitary, pineal, and cerebellar tonsils unremarkable. No upper cervical lesions. Flow voids are maintained throughout the carotid, basilar, and vertebral arteries. There are no  areas of chronic hemorrhage. Visualized calvarium, skull base, and upper cervical osseous structures unremarkable. Scalp and extracranial soft tissues, orbits, sinuses, and mastoids show no acute process.  IMPRESSION: Tiny focus of cortical restricted diffusion in a LEFT posterior parietal parasagittal gyrus consistent with small acute infarct. No posterior circulation vasogenic edema to suggest PRES.  Premature for age cerebral and cerebellar atrophy could reflect chronic ETOH abuse.  Mild subcortical and periventricular T2 and FLAIR hyperintensities, likely chronic microvascular ischemic change.   Electronically Signed   By: Davonna Belling M.D.   On: 02/19/2014 09:31   US Renal  02/19/2014   CLINICAL DATA:  Acute onset of renal insufficiency. Initial encounter.  EXAM: RENAL/URINARY TRACT ULTRASOUND COMPLETE  COMPARISON:  None.  FINDINGS: Right Kidney:  Length: 10.9 cm. Echogenicity within normal limits. No mass or hydronephrosis visualized.  Left Kidney:  Length: 11.9 cm. Echogenicity within normal limits. No mass or hydronephrosis visualized.  Bladder:  Appears normal for degree of bladder distention.  IMPRESSION: Unremarkable renal ultrasound.   Electronically Signed   By: Roanna Raider M.D.   On: 02/19/2014 07:05    Microbiology: No results found for this or any previous visit (from the past 240 hour(s)).   Labs: Basic Metabolic Panel:  Recent Labs Lab 02/18/14 1740 02/20/14 0535  NA  142 139  K 3.9 3.7  CL 101 101  CO2 24 26  GLUCOSE 115* 92  BUN 14 16  CREATININE 1.37* 1.68*  CALCIUM 9.0 8.8   Liver Function Tests:  Recent Labs Lab 02/18/14 1740 02/20/14 0535  AST 66* 160*  ALT 57* 66*  ALKPHOS 52 51  BILITOT 0.7 1.9*  PROT 7.4 6.2  ALBUMIN 3.8 3.3*   No results for input(s): LIPASE, AMYLASE in the last 168 hours. No results for input(s): AMMONIA in the last 168 hours. CBC:  Recent Labs Lab 02/18/14 1740  WBC 9.0  HGB 14.4  HCT 42.8  MCV 101.4*  PLT 295    Cardiac Enzymes: No results for input(s): CKTOTAL, CKMB, CKMBINDEX, TROPONINI in the last 168 hours. BNP: BNP (last 3 results) No results for input(s): PROBNP in the last 8760 hours. CBG: No results for input(s): GLUCAP in the last 168 hours.     SignedEddie North  [facility] 02/20/2014, 2:41 PM

## 2014-02-20 NOTE — Discharge Instructions (Signed)
DASH Eating Plan °DASH stands for "Dietary Approaches to Stop Hypertension." The DASH eating plan is a healthy eating plan that has been shown to reduce high blood pressure (hypertension). Additional health benefits may include reducing the risk of type 2 diabetes mellitus, heart disease, and stroke. The DASH eating plan may also help with weight loss. °WHAT DO I NEED TO KNOW ABOUT THE DASH EATING PLAN? °For the DASH eating plan, you will follow these general guidelines: °· Choose foods with a percent daily value for sodium of less than 5% (as listed on the food label). °· Use salt-free seasonings or herbs instead of table salt or sea salt. °· Check with your health care provider or pharmacist before using salt substitutes. °· Eat lower-sodium products, often labeled as "lower sodium" or "no salt added." °· Eat fresh foods. °· Eat more vegetables, fruits, and low-fat dairy products. °· Choose whole grains. Look for the word "whole" as the first word in the ingredient list. °· Choose fish and skinless chicken or turkey more often than red meat. Limit fish, poultry, and meat to 6 oz (170 g) each day. °· Limit sweets, desserts, sugars, and sugary drinks. °· Choose heart-healthy fats. °· Limit cheese to 1 oz (28 g) per day. °· Eat more home-cooked food and less restaurant, buffet, and fast food. °· Limit fried foods. °· Cook foods using methods other than frying. °· Limit canned vegetables. If you do use them, rinse them well to decrease the sodium. °· When eating at a restaurant, ask that your food be prepared with less salt, or no salt if possible. °WHAT FOODS CAN I EAT? °Seek help from a dietitian for individual calorie needs. °Grains °Whole grain or whole wheat bread. Brown rice. Whole grain or whole wheat pasta. Quinoa, bulgur, and whole grain cereals. Low-sodium cereals. Corn or whole wheat flour tortillas. Whole grain cornbread. Whole grain crackers. Low-sodium crackers. °Vegetables °Fresh or frozen vegetables  (raw, steamed, roasted, or grilled). Low-sodium or reduced-sodium tomato and vegetable juices. Low-sodium or reduced-sodium tomato sauce and paste. Low-sodium or reduced-sodium canned vegetables.  °Fruits °All fresh, canned (in natural juice), or frozen fruits. °Meat and Other Protein Products °Ground beef (85% or leaner), grass-fed beef, or beef trimmed of fat. Skinless chicken or turkey. Ground chicken or turkey. Pork trimmed of fat. All fish and seafood. Eggs. Dried beans, peas, or lentils. Unsalted nuts and seeds. Unsalted canned beans. °Dairy °Low-fat dairy products, such as skim or 1% milk, 2% or reduced-fat cheeses, low-fat ricotta or cottage cheese, or plain low-fat yogurt. Low-sodium or reduced-sodium cheeses. °Fats and Oils °Tub margarines without trans fats. Light or reduced-fat mayonnaise and salad dressings (reduced sodium). Avocado. Safflower, olive, or canola oils. Natural peanut or almond butter. °Other °Unsalted popcorn and pretzels. °The items listed above may not be a complete list of recommended foods or beverages. Contact your dietitian for more options. °WHAT FOODS ARE NOT RECOMMENDED? °Grains °White bread. White pasta. White rice. Refined cornbread. Bagels and croissants. Crackers that contain trans fat. °Vegetables °Creamed or fried vegetables. Vegetables in a cheese sauce. Regular canned vegetables. Regular canned tomato sauce and paste. Regular tomato and vegetable juices. °Fruits °Dried fruits. Canned fruit in light or heavy syrup. Fruit juice. °Meat and Other Protein Products °Fatty cuts of meat. Ribs, chicken wings, bacon, sausage, bologna, salami, chitterlings, fatback, hot dogs, bratwurst, and packaged luncheon meats. Salted nuts and seeds. Canned beans with salt. °Dairy °Whole or 2% milk, cream, half-and-half, and cream cheese. Whole-fat or sweetened yogurt. Full-fat   cheeses or blue cheese. Nondairy creamers and whipped toppings. Processed cheese, cheese spreads, or cheese  curds. °Condiments °Onion and garlic salt, seasoned salt, table salt, and sea salt. Canned and packaged gravies. Worcestershire sauce. Tartar sauce. Barbecue sauce. Teriyaki sauce. Soy sauce, including reduced sodium. Steak sauce. Fish sauce. Oyster sauce. Cocktail sauce. Horseradish. Ketchup and mustard. Meat flavorings and tenderizers. Bouillon cubes. Hot sauce. Tabasco sauce. Marinades. Taco seasonings. Relishes. °Fats and Oils °Butter, stick margarine, lard, shortening, ghee, and bacon fat. Coconut, palm kernel, or palm oils. Regular salad dressings. °Other °Pickles and olives. Salted popcorn and pretzels. °The items listed above may not be a complete list of foods and beverages to avoid. Contact your dietitian for more information. °WHERE CAN I FIND MORE INFORMATION? °National [employer], [employer], [employer]: www.nhlbi.nih.gov/health/health-topics/topics/dash/ °Document Released: 02/21/2011 Document Revised: 07/19/2013 Document Reviewed: 01/06/2013 °Exit[employer]® Patient Information ©2015 [employer]. This information is not intended to replace advice given to you by your health care provider. Make sure you discuss any questions you have with your health care provider. ° °

## 2014-02-20 NOTE — Plan of Care (Signed)
Problem: Acute Treatment Outcomes Goal: Neuro exam at baseline or improved Outcome: Completed/Met Date Met:  02/20/14

## 2014-02-20 NOTE — Progress Notes (Signed)
STROKE TEAM PROGRESS NOTE   HISTORY Tony Walker is an 43 y.o. male he has a history of hypertension and hyperlipidemia, he was recently on vacation through which time he did not take his blood pressure medications, when he awoke this morning he stated that his vision was mildly blurry and it has been that way all day. It was reported that the patient had a abnormal event at work where he felt as though he could not remember a period of time after walking to the bathroom he was found on the floor in the hallway and a coworker asking him if he was okay. He does not know how he got there, it was reported that he had slurred speech, some gait changes and some facial droop however the patient does not remember any of this. On arrival the patient was found to be hypertensive but otherwise back to baseline.   SBP in ED initially noted to be 170. EtOH level found to be 240 in the ED. Creatinine 1.37, AST and ALT both elevated. MRI brain images reviewed and shows a tiny cortical infarct in the left posterior parietal parasagittal gyrus.   Date last known well: 02/17/2014 Time last known well: 2200 tPA Given: no, mildness of symptoms, outside the window  SUBJECTIVE (INTERVAL HISTORY) His wife is at the bedside.  Overall he feels his condition is rapidly improving. He has no deficit now. He stated that he was not on BP meds for several days. He was going to bathroom in the middle of a conference meeting, he did not feel well, generalized weakness, and some slurry speech, and blurry vision. His colleague called ambulance and brought him over here. Denies any gait difficulty or facial droop. Now he back to his baseline.    OBJECTIVE Temp:  [98.4 F (36.9 C)-98.8 F (37.1 C)] 98.8 F (37.1 C) (12/06 1335) Pulse Rate:  [70-97] 81 (12/06 1335) Cardiac Rhythm:  [-] Sinus tachycardia (12/06 0740) Resp:  [18] 18 (12/06 1335) BP: (152-170)/(90-99) 152/91 mmHg (12/06 1335) SpO2:  [100 %] 100 % (12/06 1335)  No  results for input(s): GLUCAP in the last 168 hours.  Recent Labs Lab 02/18/14 1740 02/20/14 0535  NA 142 139  K 3.9 3.7  CL 101 101  CO2 24 26  GLUCOSE 115* 92  BUN 14 16  CREATININE 1.37* 1.68*  CALCIUM 9.0 8.8    Recent Labs Lab 02/18/14 1740 02/20/14 0535  AST 66* 160*  ALT 57* 66*  ALKPHOS 52 51  BILITOT 0.7 1.9*  PROT 7.4 6.2  ALBUMIN 3.8 3.3*    Recent Labs Lab 02/18/14 1740  WBC 9.0  HGB 14.4  HCT 42.8  MCV 101.4*  PLT 295   No results for input(s): CKTOTAL, CKMB, CKMBINDEX, TROPONINI in the last 168 hours. No results for input(s): LABPROT, INR in the last 72 hours.  Recent Labs  02/18/14 1814  COLORURINE YELLOW  LABSPEC 1.016  PHURINE 5.5  GLUCOSEU NEGATIVE  HGBUR NEGATIVE  BILIRUBINUR NEGATIVE  KETONESUR NEGATIVE  PROTEINUR NEGATIVE  UROBILINOGEN 0.2  NITRITE NEGATIVE  LEUKOCYTESUR NEGATIVE       Component Value Date/Time   CHOL 146 02/19/2014 0428   TRIG 517* 02/19/2014 0428   HDL 43 02/19/2014 0428   CHOLHDL 3.4 02/19/2014 0428   VLDL UNABLE TO CALCULATE IF TRIGLYCERIDE OVER 400 mg/dL 62/13/086512/07/2013 78460428   LDLCALC UNABLE TO CALCULATE IF TRIGLYCERIDE OVER 400 mg/dL 96/29/528412/07/2013 13240428   Lab Results  Component Value Date   HGBA1C 4.9  02/19/2014      Component Value Date/Time   LABOPIA NONE DETECTED 02/19/2014 1608   COCAINSCRNUR NONE DETECTED 02/19/2014 1608   LABBENZ NONE DETECTED 02/19/2014 1608   AMPHETMU NONE DETECTED 02/19/2014 1608   THCU NONE DETECTED 02/19/2014 1608   LABBARB NONE DETECTED 02/19/2014 1608     Recent Labs Lab 02/18/14 2217  ETH 240*   I have personally reviewed the radiological images below and agree with the radiology interpretations. Text in blue is my interpretations.  [name] Wo Contrast  02/19/2014   Internal carotid arteries widely patent. Basilar artery widely patent. Vertebrals codominant. No intracranial stenosis or aneurysm.   Mr Brain Wo Contrast  02/19/2014   IMPRESSION: Tiny focus of  cortical restricted diffusion in a LEFT posterior parietal parasagittal gyrus consistent with small acute infarct. No posterior circulation vasogenic edema to suggest PRES.  Premature for age cerebral and cerebellar atrophy could reflect chronic ETOH abuse.  Mild subcortical and periventricular T2 and FLAIR hyperintensities, likely chronic microvascular ischemic change. Artifact is also possible.   US Renal  02/19/2014   CLINICAL DATA:  Acute onset of renal insufficiency. Initial encounter.  EXAM: RENAL/URINARY TRACT ULTRASOUND COMPLETE  COMPARISON:  None.  FINDINGS: Right Kidney:  Length: 10.9 cm. Echogenicity within normal limits. No mass or hydronephrosis visualized.  Left Kidney:  Length: 11.9 cm. Echogenicity within normal limits. No mass or hydronephrosis visualized.  Bladder:  Appears normal for degree of bladder distention.  IMPRESSION: Unremarkable renal ultrasound.   Electronically Signed   By: Roanna Raider M.D.   On: 02/19/2014 07:05   CUS - Bilateral: 1-39% ICA stenosis. Vertebral artery flow is antegrade.  2D echo - pending  LDL not able to calculate but high TG at 517. A1C 4.9  PHYSICAL EXAM  Temp:  [98.4 F (36.9 C)-98.8 F (37.1 C)] 98.8 F (37.1 C) (12/06 1335) Pulse Rate:  [70-97] 81 (12/06 1335) Resp:  [18] 18 (12/06 1335) BP: (152-170)/(90-99) 152/91 mmHg (12/06 1335) SpO2:  [100 %] 100 % (12/06 1335)  General - Well nourished, well developed, in no apparent distress.  Ophthalmologic - Sharp disc margins OU.  Cardiovascular - Regular rate and rhythm with no murmur.  Mental Status -  Level of arousal and orientation to time, place, and person were intact. Language including expression, naming, repetition, comprehension, reading, and writing was assessed and found intact.  Cranial Nerves II - XII - II - Visual field intact OU. III, IV, VI - Extraocular movements intact. V - Facial sensation intact bilaterally. VII - Facial movement intact bilaterally. VIII -  Hearing & vestibular intact bilaterally. X - Palate elevates symmetrically. XI - Chin turning & shoulder shrug intact bilaterally. XII - Tongue protrusion intact.   Motor Strength - The patient's strength was normal in all extremities and pronator drift was absent.  Bulk was normal and fasciculations were absent.   Motor Tone - Muscle tone was assessed at the neck and appendages and was normal.  Reflexes - The patient's reflexes were normal in all extremities and he had no pathological reflexes.  Sensory - Light touch, temperature/pinprick, vibration and proprioception, and Romberg testing were assessed and were normal.    Coordination - The patient had normal movements in the hands and feet with no ataxia or dysmetria.  Tremor was absent.  Gait and Station - The patient's transfers, posture, gait, station, and turns were observed as normal.   ASSESSMENT/PLAN Mr. Tony Walker is a 43 y.o. male with history of HTN  presenting with episode of confusion and generalized weakness in the setting with high BP, no focal neuro deficit. He did not receive IV t-PA due to mild symptoms and out of window.   Hypertensive encephalopathy - punctate left occipital infarct can not explain his symptoms.  Resultant  No deficit  MRI  As above  MRA  unremarkable  Carotid Doppler  unremarkable  2D Echo  pnending  LDL 110, not at the goal  [id number] 4.9, on the goal  no antithrombotics prior to admission, now on aspirin 81 mg orally every day  Patient counseled to be compliant with his antithrombotic medications  Ongoing aggressive stroke risk factor management  Therapy recommendations:  Not needed  Disposition:  home  Hypertension  Home meds:   coreg  Unstable  Monitor at home  Patient counseled to be compliant with his blood pressure medications  Hyperlipidemia  Home meds:  none  LDL not able to calculate due to high TG  Add lopid for HLD  Continue statin and lopid at  discharge  Hospital day # 2  Marvel PlanJindong Raigan Baria, MD PhD Stroke Neurology 02/20/2014 10:46 PM   To contact Stroke Continuity provider, please refer to [email or web address]. After hours, contact General Neurology

## 2014-02-20 NOTE — Progress Notes (Signed)
*  PRELIMINARY RESULTS* Vascular Ultrasound Carotid Duplex (Doppler) has been completed.   Findings suggest 1-39% internal carotid artery stenosis bilaterally. Vertebral arteries are patent with antegrade flow.  02/20/2014 1:20 PM Gertie FeyMichelle Cephus Tupy, RVT, RDCS, RDMS

## 2014-02-21 ENCOUNTER — Ambulatory Visit (INDEPENDENT_AMBULATORY_CARE_PROVIDER_SITE_OTHER): Payer: BC Managed Care – PPO | Admitting: Medical

## 2014-02-21 ENCOUNTER — Encounter: Payer: Self-pay | Admitting: Medical

## 2014-02-21 VITALS — BP 150/90 | HR 85 | Temp 98.4°F | Ht 74.75 in | Wt 251.4 lb

## 2014-02-21 DIAGNOSIS — R1011 Right upper quadrant pain: Secondary | ICD-10-CM

## 2014-02-21 DIAGNOSIS — N289 Disorder of kidney and ureter, unspecified: Secondary | ICD-10-CM

## 2014-02-21 DIAGNOSIS — I10 Essential (primary) hypertension: Secondary | ICD-10-CM

## 2014-02-21 LAB — HEPATITIS PANEL, ACUTE
HCV Ab: NEGATIVE
Hep A IgM: NONREACTIVE
Hep B C IgM: NONREACTIVE
Hepatitis B Surface Ag: NEGATIVE

## 2014-02-21 NOTE — Assessment & Plan Note (Signed)
On off right upper quadrant pain associated with drinking alcohol. I did go ahead and put in the order for abdominal ultrasound to be done hopefully this week. With the LFT elevation we might be able to see some fatty liver or may be some gallbladder disease. Hopefully neither but if patient does have some fatty liver or if any hepatitis  this might help him be more motivated to stop drinking alcohol completely.

## 2014-02-21 NOTE — Progress Notes (Signed)
Pre visit review using our clinic review tool, if applicable. No additional management support is needed unless otherwise documented below in the visit note. 

## 2014-02-21 NOTE — Progress Notes (Signed)
Subjective:    Patient ID: Tony Walker, male    DOB: 04-21-1970, 43 y.o.   MRN: [id number]  HPI   Pt states that he went to the ED. That was this past Friday. He felt weak at that time(may have had a syncopal episode but that is unclear). He states really stressed at work.  Prior to ED evaluation he did forget to take his meds for couple of days. He was admitted to the hospital. Kept for 2 days then discharged.  Pt was on carvedilol 6.25 twice a day. Then he was given 12.5 mg twice a day. Pt was given hydralazine 25 mg tid.   Triglycricedes were high above 500. Pt has rx of gemfibrozil. He has stopped drinking alcohol recently.  Pt US of his kidneys looked good.  Pt mentioned that occasional rt upper quadrant pain on and off. Mild and when he would drink in the past. Some elevated liver enzymes. He was about to get ultrasound in the hospital before discharge but then that was canceled.   Past Medical History  Diagnosis Date  . Hypertension 12/18/2011  . Hyperlipidemia 12/18/2011    History   Social History  . Marital Status: Married    Spouse Name: N/A    Number of Children: N/A  . Years of Education: N/A   Occupational History  . banker    Social History Main Topics  . Smoking status: Former Games developermoker  . Smokeless tobacco: Not on file     Comment: d/c cigars 2013  . Alcohol Use: Yes     Comment: socially   . Drug Use: No  . Sexual Activity: No   Other Topics Concern  . Not on file   Social History Narrative      Moved to the area ~ 2011, from [place]          No past surgical history on file.  Family History  Problem Relation Age of Onset  . Hypertension Mother   . Alcoholism Father     No Known Allergies  Current Outpatient Prescriptions on File Prior to Visit  Medication Sig Dispense Refill  . Alum & Mag Hydroxide-Simeth (MAGIC MOUTHWASH W/LIDOCAINE) SOLN Take 5 mLs by mouth 4 (four) times daily as needed for mouth pain. Take 5 ml by mouth swish and  spit four times daily 200 mL 0  . aspirin EC 81 MG tablet Take 1 tablet (81 mg total) by mouth daily. 30 tablet 0  . carvedilol (COREG) 12.5 MG tablet Take 1 tablet (12.5 mg total) by mouth 2 (two) times daily with a meal. 60 tablet 0  . gemfibrozil (LOPID) 600 MG tablet Take 1 tablet (600 mg total) by mouth 2 (two) times daily before a meal. 60 tablet 0  . hydrALAZINE (APRESOLINE) 25 MG tablet Take 1 tablet (25 mg total) by mouth every 8 (eight) hours. 90 tablet 0   No current facility-administered medications on file prior to visit.    BP 150/90 mmHg  Pulse 85  Temp(Src) 98.4 F (36.9 C) (Oral)  Ht 6' 2.75" (1.899 m)  Wt 251 lb 6.4 oz (114.034 kg)  BMI 31.62 kg/m2  SpO2 98%      Review of Systems  Constitutional: Negative for fever, chills, diaphoresis, activity change and fatigue.  Respiratory: Negative for cough, chest tightness and shortness of breath.   Cardiovascular: Negative for chest pain, palpitations and leg swelling.  Gastrointestinal: Negative for nausea, vomiting and abdominal pain.  Musculoskeletal: Negative for neck  pain and neck stiffness.  Neurological: Negative for dizziness, tremors, seizures, syncope, facial asymmetry, speech difficulty, weakness, light-headedness, numbness and headaches.  Psychiatric/Behavioral: Negative for behavioral problems, confusion and agitation. The patient is not nervous/anxious.        Objective:   Physical Exam   General Mental Status- Alert. General Appearance- Not in acute distress.   Skin General: Color- Normal Color. Moisture- Normal Moisture.  Neck Carotid Arteries- Normal color. Moisture- Normal Moisture. No carotid bruits. No JVD.  Chest and Lung Exam Auscultation: Breath Sounds:-Normal. CTA  Cardiovascular Auscultation:Rythm- Regular,rate and rhythm. Murmurs & Other Heart Sounds:Auscultation of the heart reveals- No Murmurs.  Abdomen Inspection:-Inspeection Normal. Palpation/Percussion:Note:No mass.  Palpation and Percussion of the abdomen reveal- Non Tender, Non Distended + BS, no rebound or guarding.    Neurologic Cranial Nerve exam:- CN III-XII intact(No nystagmus), symmetric smile. Drift Test:- No drift. Romberg Exam:- Negative.  Heal to Toe Gait exam:-Normal. Finger to Nose:- Normal/Intact Strength:- 5/5 equal and symmetric strength both upper and lower extremities.        Assessment & Plan:

## 2014-02-21 NOTE — Patient Instructions (Addendum)
For your htn, I want you to continue hydralazine 25 mg po tid and the carvedilol 12.5 mg po bid. I want you to bring your bp monitor for next visit in one week. If any neurologic or cardiac symptoms then ED evaluation.  For your abdominal pain on and off in ruq, I am going to order a abdominal ultrasound.  You have some renal insufficiency on labs when hospitalized. I will repeat that in one week.  Please stop drinking alcohol completely.  Follow up in one week or as needed.

## 2014-02-21 NOTE — Assessment & Plan Note (Signed)
You have some renal insufficiency on labs when hospitalized. I will repeat that in one week. I am hoping that the labs will look improved on repeat in one week.

## 2014-02-21 NOTE — Assessment & Plan Note (Signed)
For your htn, I want you to continue hydralazine 25 mg po tid and the carvedilol 12.5 mg po bid. I want you to bring your bp monitor for next visit in one week. If any neurologic or cardiac symptoms then ED evaluation.  Patient just started the hydralazine recently in the full effect of the medication may not be realized just yet.

## 2014-02-23 ENCOUNTER — Ambulatory Visit (HOSPITAL_BASED_OUTPATIENT_CLINIC_OR_DEPARTMENT_OTHER): Payer: BC Managed Care – PPO

## 2014-02-23 ENCOUNTER — Encounter: Payer: Self-pay | Admitting: Internal Medicine

## 2014-02-23 ENCOUNTER — Ambulatory Visit (INDEPENDENT_AMBULATORY_CARE_PROVIDER_SITE_OTHER): Payer: BC Managed Care – PPO | Admitting: Internal Medicine

## 2014-02-23 VITALS — BP 138/82 | HR 105 | Temp 97.6°F | Wt 251.5 lb

## 2014-02-23 DIAGNOSIS — I10 Essential (primary) hypertension: Secondary | ICD-10-CM

## 2014-02-23 DIAGNOSIS — E785 Hyperlipidemia, unspecified: Secondary | ICD-10-CM

## 2014-02-23 DIAGNOSIS — R7401 Elevation of levels of liver transaminase levels: Secondary | ICD-10-CM

## 2014-02-23 DIAGNOSIS — I639 Cerebral infarction, unspecified: Secondary | ICD-10-CM

## 2014-02-23 DIAGNOSIS — F1010 Alcohol abuse, uncomplicated: Secondary | ICD-10-CM

## 2014-02-23 DIAGNOSIS — N289 Disorder of kidney and ureter, unspecified: Secondary | ICD-10-CM

## 2014-02-23 DIAGNOSIS — R740 Nonspecific elevation of levels of transaminase and lactic acid dehydrogenase [LDH]: Secondary | ICD-10-CM

## 2014-02-23 MED ORDER — LORAZEPAM 0.5 MG PO TABS: 0.5000 mg | ORAL_TABLET | Freq: Every evening | ORAL | Status: DC | PRN

## 2014-02-23 NOTE — Patient Instructions (Signed)
Get your blood work before you leave   Check the  blood pressure 2   times a day  Be sure your blood pressure is between  145/85  and 110/65.  if it is consistently higher or lower, let me know  Take Ativan 1 or 2 tablets at bedtime if you feel anxious, shaky or you have palpitations. Watch for excessive somnolence  Low salt diet

## 2014-02-23 NOTE — Progress Notes (Signed)
Pre visit review using our clinic review tool, if applicable. No additional management support is needed unless otherwise documented below in the visit note. 

## 2014-02-23 NOTE — Progress Notes (Signed)
Subjective:    Patient ID: Tony Walker, male    DOB: 28-Jan-1971, 43 y.o.   MRN: [id number]  DOS:  02/23/2014 Type of visit - description :  Interval history: The patient has a complicated history: Last time I saw him was in April 2015, BP was under excellent control 09-2013, amlodipine was discontinued due to patient's concern about gingivitis, benazepril dose was increased  09-2013, BP was elevated, benazepril discontinue and patient is started on losartan and carvedilol Admitted to the hospital 02/18/2014: Syncope, confusion  elevated LFTs.  History of alcohol abuse-- was recommended abstinence, folate and multivitamins Elevated blood pressure-- hospitalist increase carvedilol dose and added hydralazine. Losartan discontinued due to kidney function. CVA--CT of the brain negative, MRI show a small acute infarct. High cholesterol, high triglycerides holding Lipitor due transaminitis.on gemfibrozil    ROS Since he left the hospital, he was having headache and dizziness but that is better. At night, he reports palpitations   not associated with nausea, diaphoresis. + anxiety Good compliance with BP meds BP 2 days ago was 160, yesterday was 150/99. This morning was in the 150s but today during this office visit is 132/82.   Past Medical History  Diagnosis Date  . Hypertension 12/18/2011  . Hyperlipidemia 12/18/2011  . Stroke 02-2014    Past Surgical History  Procedure Laterality Date  . No past surgeries      History   Social History  . Marital Status: Married    Spouse Name: N/A    Number of Children: N/A  . Years of Education: N/A   Occupational History  . banker    Social History Main Topics  . Smoking status: Former Games developermoker  . Smokeless tobacco: Not on file     Comment: d/c cigars 2013  . Alcohol Use: Yes     Comment: socially   . Drug Use: No  . Sexual Activity: No   Other Topics Concern  . Not on file   Social History Narrative      Moved to the area ~  2011, from [place]              Medication List       This list is accurate as of: 02/23/14 11:59 PM.  Always use your most recent med list.               aspirin EC 81 MG tablet  Take 1 tablet (81 mg total) by mouth daily.     carvedilol 12.5 MG tablet  Commonly known as:  COREG  Take 1 tablet (12.5 mg total) by mouth 2 (two) times daily with a meal.     gemfibrozil 600 MG tablet  Commonly known as:  LOPID  Take 1 tablet (600 mg total) by mouth 2 (two) times daily before a meal.     hydrALAZINE 25 MG tablet  Commonly known as:  APRESOLINE  Take 1 tablet (25 mg total) by mouth every 8 (eight) hours.     LORazepam 0.5 MG tablet  Commonly known as:  ATIVAN  Take 1-2 tablets (0.5-1 mg total) by mouth at bedtime as needed for anxiety.     magic mouthwash w/lidocaine Soln  Take 5 mLs by mouth 4 (four) times daily as needed for mouth pain. Take 5 ml by mouth swish and spit four times daily           Objective:   Physical Exam BP 138/82 mmHg  Pulse 105  Temp(Src) 97.6 F (36.4  C) (Oral)  Wt 251 lb 8 oz (114.08 kg)  SpO2 97%  General -- alert, well-developed, NAD.  HEENT-- Not pale.  Lungs -- normal respiratory effort, no intercostal retractions, no accessory muscle use, and normal breath sounds.  Heart-- normal rate, regular rhythm, no murmur.  Abdomen-- Not distended, good bowel sounds,soft, non-tender. No mass-bruit  Extremities-- no pretibial edema bilaterally  Neurologic--  alert & oriented X3. Speech normal, gait appropriate for age, strength symmetric and appropriate for age.  Psych-- Cognition and judgment appear intact. Cooperative with normal attention span and concentration. No anxious or depressed appearing.       Assessment & Plan:

## 2014-02-24 LAB — COMPREHENSIVE METABOLIC PANEL
ALT: 71 U/L — ABNORMAL HIGH (ref 0–53)
AST: 73 U/L — ABNORMAL HIGH (ref 0–37)
Albumin: 4.5 g/dL (ref 3.5–5.2)
Alkaline Phosphatase: 50 U/L (ref 39–117)
BILIRUBIN TOTAL: 0.6 mg/dL (ref 0.2–1.2)
BUN: 14 mg/dL (ref 6–23)
CALCIUM: 9.9 mg/dL (ref 8.4–10.5)
CO2: 25 meq/L (ref 19–32)
Chloride: 103 mEq/L (ref 96–112)
Creatinine, Ser: 1.3 mg/dL (ref 0.4–1.5)
GFR: 75.36 mL/min (ref >60.00)
GLUCOSE: 97 mg/dL (ref 70–99)
Potassium: 4.3 mEq/L (ref 3.5–5.1)
SODIUM: 137 meq/L (ref 135–145)
TOTAL PROTEIN: 8.1 g/dL (ref 6.0–8.3)

## 2014-02-24 NOTE — Assessment & Plan Note (Signed)
Hypertension Back in  April 2015, BP was under excellent control 09-2013, amlodipine was discontinued due to patient's concern about gingivitis, benazepril dose was increased  09-2013, BP was elevated, benazepril discontinue and patient is started on losartan and carvedilol Admitted to the hospital few days ago, losartan discontinued due to kidney dysfunction. Hospitalist added hydralazine and increase carvedilol dose. Plan-- Continue with carvedilol 12.5 and hydralazine 3 times a day. Continue monitoring BPs (BP here wnl, with his meter was 162/108, needs a new meter) CMP Follow-up 10 days

## 2014-02-24 NOTE — Assessment & Plan Note (Signed)
Elevated LFTs, Hepatitis serology negative, recheck labs. He does drink alcohol

## 2014-02-24 NOTE — Assessment & Plan Note (Signed)
EtOH? Reports he drunk alcohol heavily prior to admission but not heavy-freq drinking otherwise. States he is not going to drink. Has occasional palpitations associated with anxiety at nighttime, I wonder if that is related to alcohol abstinence. Plan to prescribe a small amount of Ativan for bedtime. If palpitations persist he will let me know.

## 2014-02-24 NOTE — Assessment & Plan Note (Signed)
Renal insufficiency, Creatinine was 1.1 on April 2015, increased to 1.6 recently. Currently not taking ACE inhibitors or ARBs Recheck labs Renal ultrasound 02/18/2014 negative

## 2014-02-24 NOTE — Assessment & Plan Note (Signed)
  Hyperlipidemia, high  triglycerides Holding Lipitor due to LFTs On gemfibrozil.

## 2014-02-24 NOTE — Assessment & Plan Note (Signed)
Recently admitted with a stroke in the setting of elevated BP, plan is to control cardiovascular risk factors

## 2014-02-26 ENCOUNTER — Ambulatory Visit (HOSPITAL_BASED_OUTPATIENT_CLINIC_OR_DEPARTMENT_OTHER): Admission: RE | Admit: 2014-02-26 | Payer: BC Managed Care – PPO | Source: Ambulatory Visit

## 2014-02-28 ENCOUNTER — Ambulatory Visit: Payer: BC Managed Care – PPO | Admitting: Medical

## 2014-02-28 ENCOUNTER — Telehealth: Payer: Self-pay | Admitting: Internal Medicine

## 2014-02-28 DIAGNOSIS — Z7689 Persons encountering health services in other specified circumstances: Secondary | ICD-10-CM

## 2014-02-28 NOTE — Telephone Encounter (Signed)
Patient calling back regarding this. He states that he was supposed to return to work today. [phone] # [phone]

## 2014-02-28 NOTE — Telephone Encounter (Signed)
Paperwork filled out as much as possible and forwarded to Dr. Drue NovelPaz. JG//CMA

## 2014-02-28 NOTE — Telephone Encounter (Signed)
Completed paperwork faxed to [employer] at [phone], fax confirmation received. Called and informed patient and copy placed up front for him to pick up. JG//CMA

## 2014-02-28 NOTE — Telephone Encounter (Signed)
Pt would like to know the status of hes FMLA. Please advise

## 2014-03-07 ENCOUNTER — Encounter: Payer: Self-pay | Admitting: Internal Medicine

## 2014-03-07 ENCOUNTER — Ambulatory Visit (INDEPENDENT_AMBULATORY_CARE_PROVIDER_SITE_OTHER): Payer: BC Managed Care – PPO | Admitting: Internal Medicine

## 2014-03-07 VITALS — BP 146/94 | HR 88 | Temp 98.1°F | Wt 253.1 lb

## 2014-03-07 DIAGNOSIS — R740 Nonspecific elevation of levels of transaminase and lactic acid dehydrogenase [LDH]: Secondary | ICD-10-CM

## 2014-03-07 DIAGNOSIS — R7401 Elevation of levels of liver transaminase levels: Secondary | ICD-10-CM

## 2014-03-07 DIAGNOSIS — E785 Hyperlipidemia, unspecified: Secondary | ICD-10-CM

## 2014-03-07 DIAGNOSIS — I10 Essential (primary) hypertension: Secondary | ICD-10-CM

## 2014-03-07 MED ORDER — CARVEDILOL 25 MG PO TABS: 25.0000 mg | ORAL_TABLET | Freq: Two times a day (BID) | ORAL | Status: DC

## 2014-03-07 MED ORDER — CARVEDILOL PHOSPHATE ER 40 MG PO CP24: 40.0000 mg | ORAL_CAPSULE | Freq: Every day | ORAL | Status: DC

## 2014-03-07 NOTE — Progress Notes (Signed)
Pre visit review using our clinic review tool, if applicable. No additional management support is needed unless otherwise documented below in the visit note. 

## 2014-03-07 NOTE — Assessment & Plan Note (Addendum)
BP still slightly elevated. Continue with carvedilol, increased dose to 25 mg twice a day and continue hydralazine. Check a CMP in 3 weeks The patient is concerned about the  pill burden, for now I encouraged him to continue with hydralazine TID as options are somewhat limited, see previous entry. to decrease the pill burden could try Coreg CR if cost ok

## 2014-03-07 NOTE — Assessment & Plan Note (Signed)
On Lopid, check labs in 3 weeks

## 2014-03-07 NOTE — Patient Instructions (Signed)
Stop by the front desk and schedule labs to be done in 3 weeks from today (fasting)  Increase carvedilol to 25 mg one tablet twice a day As an  option you could get the Coreg CR 40 mg and take only one tablet in the morning. Cannot take both.  Check the  blood pressure 2 or 3 times a week  Be sure your blood pressure is between  145/85  and 110/65.  if it is consistently higher or lower, let me know      Please come back to the office in 3 months  for a routine check up

## 2014-03-07 NOTE — Assessment & Plan Note (Addendum)
LFTs decreasing, patient not drinking, recheck in 3 weeks

## 2014-03-07 NOTE — Progress Notes (Signed)
   Subjective:    Patient ID: Tony Walker, male    DOB: [birth year], 43 y.o.   MRN: [id number]  DOS:  03/07/2014 Type of visit - description : Follow-up Interval history: Since the last day he was here he is taking the medication correctly. Has improved his diet. Ambulatory BPs a slightly elevated, he checked 3 times: 140/110, 140/100, 120/90 more recently   ROS Denies any side effects from the medications but would like to decrease the number of pills he takes. No chest pain or difficulty breathing No nausea, vomiting, diarrhea. Occasional headaches in the morning.  Past Medical History  Diagnosis Date  . Hypertension 12/18/2011  . Hyperlipidemia 12/18/2011  . Stroke 02-2014    Past Surgical History  Procedure Laterality Date  . No past surgeries      History   Social History  . Marital Status: Married    Spouse Name: N/A    Number of Children: N/A  . Years of Education: N/A   Occupational History  . banker    Social History Main Topics  . Smoking status: Former Games developermoker  . Smokeless tobacco: Not on file     Comment: d/c cigars 2013  . Alcohol Use: Yes     Comment: socially   . Drug Use: No  . Sexual Activity: No   Other Topics Concern  . Not on file   Social History Narrative      Moved to the area ~ 2011, from [place]              Medication List       This list is accurate as of: 03/07/14 11:59 PM.  Always use your most recent med list.               aspirin EC 81 MG tablet  Take 1 tablet (81 mg total) by mouth daily.     carvedilol 25 MG tablet  Commonly known as:  COREG  Take 1 tablet (25 mg total) by mouth 2 (two) times daily with a meal.     carvedilol 40 MG 24 hr capsule  Commonly known as:  COREG CR  Take 1 capsule (40 mg total) by mouth daily.     gemfibrozil 600 MG tablet  Commonly known as:  LOPID  Take 1 tablet (600 mg total) by mouth 2 (two) times daily before a meal.     hydrALAZINE 25 MG tablet  Commonly known as:   APRESOLINE  Take 1 tablet (25 mg total) by mouth every 8 (eight) hours.     LORazepam 0.5 MG tablet  Commonly known as:  ATIVAN  Take 1-2 tablets (0.5-1 mg total) by mouth at bedtime as needed for anxiety.           Objective:   Physical Exam BP 146/94 mmHg  Pulse 88  Temp(Src) 98.1 F (36.7 C) (Oral)  Wt 253 lb 2 oz (114.817 kg)  SpO2 96%  General -- alert, well-developed, NAD.  Extremities-- no pretibial edema bilaterally  Neurologic--  alert & oriented X3. Speech normal, gait appropriate for age, strength symmetric and appropriate for age.  Psych-- Cognition and judgment appear intact. Cooperative with normal attention span and concentration. No anxious or depressed appearing.     Assessment & Plan:

## 2014-03-12 ENCOUNTER — Other Ambulatory Visit: Payer: Self-pay | Admitting: Internal Medicine

## 2014-03-14 ENCOUNTER — Telehealth: Payer: Self-pay

## 2014-03-14 ENCOUNTER — Other Ambulatory Visit: Payer: Self-pay

## 2014-03-14 MED ORDER — LORAZEPAM 0.5 MG PO TABS: 0.5000 mg | ORAL_TABLET | Freq: Every evening | ORAL | Status: DC | PRN

## 2014-03-14 NOTE — Telephone Encounter (Signed)
Pt is requesting refill on Lorazepam.  Last OV: 03/07/2014 Last Fill: 02/23/2014 # 30 0RF: Pt can take 1 to 2 tablets daily at bedtime PRN UDS: None  Please advise.

## 2014-03-14 NOTE — Telephone Encounter (Signed)
done

## 2014-03-14 NOTE — Telephone Encounter (Signed)
Faxed to [facility].

## 2014-03-15 ENCOUNTER — Telehealth: Payer: Self-pay | Admitting: Internal Medicine

## 2014-03-15 ENCOUNTER — Other Ambulatory Visit: Payer: Self-pay | Admitting: Internal Medicine

## 2014-03-15 MED ORDER — GEMFIBROZIL 600 MG PO TABS: ORAL_TABLET | ORAL | Status: DC

## 2014-03-15 MED ORDER — CARVEDILOL 25 MG PO TABS: 25.0000 mg | ORAL_TABLET | Freq: Two times a day (BID) | ORAL | Status: DC

## 2014-03-15 NOTE — Telephone Encounter (Signed)
Caller name:Bennion, Windell  Relation to WU:JWJXpt:self  Call back number: [phone] [facility] [phone]  Reason for call:  Pt requesting a 90 day supply instead of 30 day supply to hold him over until next appointment.   carvedilol (COREG) 25 MG tablet  gemfibrozil (LOPID) 600 MG tablet  hydrALAZINE (APRESOLINE) 25 MG tablet

## 2014-03-15 NOTE — Telephone Encounter (Signed)
Hydralazine already refilled as 90 day supply. Carvedilol and Gemfibrozil refilled at last appt, for 30 days as requested by Pt at appt. However, [facility] day supply.

## 2014-03-28 ENCOUNTER — Other Ambulatory Visit (INDEPENDENT_AMBULATORY_CARE_PROVIDER_SITE_OTHER): Payer: BLUE CROSS/BLUE SHIELD

## 2014-03-28 DIAGNOSIS — E785 Hyperlipidemia, unspecified: Secondary | ICD-10-CM

## 2014-03-28 DIAGNOSIS — I10 Essential (primary) hypertension: Secondary | ICD-10-CM

## 2014-03-28 LAB — LIPID PANEL
CHOLESTEROL: 161 mg/dL (ref 0–200)
HDL: 38.9 mg/dL — ABNORMAL LOW (ref >39.00)
LDL Cholesterol: 111 mg/dL — ABNORMAL HIGH (ref 0–99)
NONHDL: 122.1
Total CHOL/HDL Ratio: 4
Triglycerides: 57 mg/dL (ref 0.0–149.0)
VLDL: 11.4 mg/dL (ref 0.0–40.0)

## 2014-03-28 LAB — COMPREHENSIVE METABOLIC PANEL
ALT: 48 U/L (ref 0–53)
AST: 43 U/L — AB (ref 0–37)
Albumin: 4.6 g/dL (ref 3.5–5.2)
Alkaline Phosphatase: 36 U/L — ABNORMAL LOW (ref 39–117)
BILIRUBIN TOTAL: 0.5 mg/dL (ref 0.2–1.2)
BUN: 15 mg/dL (ref 6–23)
CALCIUM: 9.9 mg/dL (ref 8.4–10.5)
CO2: 24 meq/L (ref 19–32)
Chloride: 107 mEq/L (ref 96–112)
Creatinine, Ser: 1.2 mg/dL (ref 0.4–1.5)
GFR: 86.48 mL/min (ref >60.00)
Glucose, Bld: 109 mg/dL — ABNORMAL HIGH (ref 70–99)
Potassium: 4.3 mEq/L (ref 3.5–5.1)
SODIUM: 138 meq/L (ref 135–145)
Total Protein: 7.9 g/dL (ref 6.0–8.3)

## 2014-06-14 ENCOUNTER — Ambulatory Visit: Payer: BC Managed Care – PPO | Admitting: Internal Medicine

## 2014-06-15 ENCOUNTER — Other Ambulatory Visit: Payer: Self-pay

## 2014-06-16 ENCOUNTER — Ambulatory Visit: Payer: BLUE CROSS/BLUE SHIELD | Admitting: Internal Medicine

## 2014-06-22 ENCOUNTER — Telehealth: Payer: Self-pay | Admitting: Internal Medicine

## 2014-06-22 ENCOUNTER — Encounter: Payer: Self-pay | Admitting: Internal Medicine

## 2014-06-22 NOTE — Telephone Encounter (Signed)
Pt was no show for follow up appt on 06/16/14- previous appt on 06/14/14 cancelled- letter sent. Charge?

## 2014-06-24 NOTE — Telephone Encounter (Signed)
Yes, please send No show fee.

## 2014-07-06 NOTE — Telephone Encounter (Signed)
error:[id number] ° °

## 2014-08-03 ENCOUNTER — Other Ambulatory Visit: Payer: Self-pay | Admitting: Internal Medicine

## 2014-08-30 ENCOUNTER — Other Ambulatory Visit: Payer: Self-pay | Admitting: Internal Medicine

## 2014-08-31 NOTE — Telephone Encounter (Signed)
Pt is requesting refill on Lorazepam.  Last OV: 03/07/2014 Last Fill: 03/14/2014 #30 0RF UDS: None  Please advise.

## 2014-08-31 NOTE — Telephone Encounter (Signed)
Rx printed, awaiting MD signature.  

## 2014-08-31 NOTE — Telephone Encounter (Signed)
[facility].  

## 2014-08-31 NOTE — Telephone Encounter (Signed)
Ok #30, no RF 

## 2014-09-25 ENCOUNTER — Other Ambulatory Visit: Payer: Self-pay | Admitting: Internal Medicine

## 2014-10-22 ENCOUNTER — Other Ambulatory Visit: Payer: Self-pay | Admitting: Internal Medicine

## 2014-11-10 ENCOUNTER — Other Ambulatory Visit: Payer: Self-pay | Admitting: Internal Medicine

## 2014-12-28 ENCOUNTER — Other Ambulatory Visit: Payer: Self-pay | Admitting: Internal Medicine

## 2015-01-03 ENCOUNTER — Other Ambulatory Visit: Payer: Self-pay | Admitting: Internal Medicine

## 2015-01-31 ENCOUNTER — Telehealth: Payer: Self-pay | Admitting: Internal Medicine

## 2015-01-31 ENCOUNTER — Other Ambulatory Visit: Payer: Self-pay | Admitting: Internal Medicine

## 2015-01-31 NOTE — Telephone Encounter (Signed)
Pt has not been seen since 03/07/2014, which he was informed to F/U in 3 months due to elevated BP. 30 day supplies given on 01/03/2015 and informed to schedule appt, letter also mailed to Pt. No further refills until seen.

## 2015-01-31 NOTE — Telephone Encounter (Signed)
Left message for patient to call office to schedule appt.

## 2015-01-31 NOTE — Telephone Encounter (Signed)
Caller name: Self    Can be reached:[phone]  Pharmacy:  [id number] [address] [phone] (Phone) [phone] (Fax)         Reason for call: Patient states that pharmacy informed him that his hydrALAZINE (APRESOLINE) 25 MG tablet [[id number]][[id number]] and carvedilol (COREG CR) 40 MG 24 hr capsule [[id number]][[id number]] were denied. Plse Adv

## 2015-02-03 ENCOUNTER — Other Ambulatory Visit: Payer: Self-pay | Admitting: Internal Medicine

## 2015-02-06 ENCOUNTER — Ambulatory Visit: Payer: BLUE CROSS/BLUE SHIELD | Admitting: Internal Medicine

## 2015-02-06 ENCOUNTER — Ambulatory Visit: Payer: Self-pay | Admitting: Internal Medicine

## 2015-02-06 DIAGNOSIS — Z0289 Encounter for other administrative examinations: Secondary | ICD-10-CM

## 2015-02-07 ENCOUNTER — Encounter: Payer: Self-pay | Admitting: Internal Medicine

## 2015-02-07 ENCOUNTER — Ambulatory Visit (INDEPENDENT_AMBULATORY_CARE_PROVIDER_SITE_OTHER): Payer: BLUE CROSS/BLUE SHIELD | Admitting: Internal Medicine

## 2015-02-07 VITALS — BP 122/80 | HR 71 | Temp 98.0°F | Ht 74.75 in | Wt 273.2 lb

## 2015-02-07 DIAGNOSIS — I10 Essential (primary) hypertension: Secondary | ICD-10-CM

## 2015-02-07 DIAGNOSIS — E785 Hyperlipidemia, unspecified: Secondary | ICD-10-CM | POA: Diagnosis not present

## 2015-02-07 DIAGNOSIS — Z114 Encounter for screening for human immunodeficiency virus [HIV]: Secondary | ICD-10-CM

## 2015-02-07 DIAGNOSIS — F419 Anxiety disorder, unspecified: Secondary | ICD-10-CM

## 2015-02-07 DIAGNOSIS — Z09 Encounter for follow-up examination after completed treatment for conditions other than malignant neoplasm: Secondary | ICD-10-CM | POA: Insufficient documentation

## 2015-02-07 LAB — COMPREHENSIVE METABOLIC PANEL
ALT: 30 U/L (ref 0–53)
AST: 38 U/L — AB (ref 0–37)
Albumin: 4.5 g/dL (ref 3.5–5.2)
Alkaline Phosphatase: 33 U/L — ABNORMAL LOW (ref 39–117)
BILIRUBIN TOTAL: 0.6 mg/dL (ref 0.2–1.2)
BUN: 15 mg/dL (ref 6–23)
CALCIUM: 9.7 mg/dL (ref 8.4–10.5)
CO2: 23 meq/L (ref 19–32)
CREATININE: 1.11 mg/dL (ref 0.40–1.50)
Chloride: 106 mEq/L (ref 96–112)
GFR: 92.44 mL/min (ref >60.00)
GLUCOSE: 95 mg/dL (ref 70–99)
Potassium: 3.7 mEq/L (ref 3.5–5.1)
Sodium: 138 mEq/L (ref 135–145)
TOTAL PROTEIN: 7.7 g/dL (ref 6.0–8.3)

## 2015-02-07 LAB — CBC WITH DIFFERENTIAL/PLATELET
BASOS ABS: 0.1 10*3/uL (ref 0.0–0.1)
Basophils Relative: 0.7 % (ref 0.0–3.0)
EOS ABS: 0.3 10*3/uL (ref 0.0–0.7)
Eosinophils Relative: 3.5 % (ref 0.0–5.0)
HCT: 42.3 % (ref 39.0–52.0)
Hemoglobin: 13.8 g/dL (ref 13.0–17.0)
LYMPHS ABS: 2.4 10*3/uL (ref 0.7–4.0)
LYMPHS PCT: 32.7 % (ref 12.0–46.0)
MCHC: 32.6 g/dL (ref 30.0–36.0)
MCV: 93.4 fl (ref 78.0–100.0)
Monocytes Absolute: 0.7 10*3/uL (ref 0.1–1.0)
Monocytes Relative: 9.2 % (ref 3.0–12.0)
NEUTROS ABS: 3.9 10*3/uL (ref 1.4–7.7)
NEUTROS PCT: 53.9 % (ref 43.0–77.0)
PLATELETS: 300 10*3/uL (ref 150.0–400.0)
RBC: 4.52 Mil/uL (ref 4.22–5.81)
RDW: 12.8 % (ref 11.5–15.5)
WBC: 7.3 10*3/uL (ref 4.0–10.5)

## 2015-02-07 MED ORDER — GEMFIBROZIL 600 MG PO TABS: 600.0000 mg | ORAL_TABLET | Freq: Two times a day (BID) | ORAL | Status: DC

## 2015-02-07 MED ORDER — LORAZEPAM 0.5 MG PO TABS: 0.5000 mg | ORAL_TABLET | Freq: Every evening | ORAL | Status: DC | PRN

## 2015-02-07 MED ORDER — HYDRALAZINE HCL 25 MG PO TABS: 25.0000 mg | ORAL_TABLET | Freq: Three times a day (TID) | ORAL | Status: DC

## 2015-02-07 MED ORDER — CARVEDILOL 25 MG PO TABS: 25.0000 mg | ORAL_TABLET | Freq: Two times a day (BID) | ORAL | Status: DC

## 2015-02-07 NOTE — Patient Instructions (Signed)
Get your blood work before you leave . We also need a UDS   Next visit  for a  complete physical exam, fasting in 6 months  Please schedule an appointment at the front desk

## 2015-02-07 NOTE — Progress Notes (Signed)
Pre visit review using our clinic review tool, if applicable. No additional management support is needed unless otherwise documented below in the visit note. 

## 2015-02-07 NOTE — Progress Notes (Signed)
Subjective:    Patient ID: Tony Walker, male    DOB: 19-May-1970, 44 y.o.   MRN: [id number]  DOS:  02/07/2015 Type of visit - description : Follow-up Interval history: HTN: Reports good ambulatory BPs, has been taking his medication qd   most of the time. Hyperlipidemia: On gemfibrozil, has been taking his medication qd  Most of the time Anxiety, insomnia: on Ativan as needed. Request a refill. EtOH: Reports that is not a problem at the present time, he drinks seldom, socially.   Review of Systems No fever chills No chest pain or difficulty breathing No nausea, vomiting, diarrhea No headache or dizziness  Past Medical History  Diagnosis Date  . Hypertension 12/18/2011  . Hyperlipidemia 12/18/2011  . Stroke [facility] 02-2014    Past Surgical History  Procedure Laterality Date  . No past surgeries      Social History   Social History  . Marital Status: Married    Spouse Name: N/A  . Number of Children: N/A  . Years of Education: N/A   Occupational History  . banker    Social History Main Topics  . Smoking status: Former Games developermoker  . Smokeless tobacco: Not on file     Comment: d/c cigars 2013  . Alcohol Use: Yes     Comment: socially   . Drug Use: No  . Sexual Activity: No   Other Topics Concern  . Not on file   Social History Narrative      Moved to the area ~ 2011, from [place]              Medication List       This list is accurate as of: 02/07/15  7:59 PM.  Always use your most recent med list.               aspirin EC 81 MG tablet  Take 1 tablet (81 mg total) by mouth daily.     carvedilol 25 MG tablet  Commonly known as:  COREG  Take 1 tablet (25 mg total) by mouth 2 (two) times daily with a meal.     gemfibrozil 600 MG tablet  Commonly known as:  LOPID  Take 1 tablet (600 mg total) by mouth 2 (two) times daily before a meal.     hydrALAZINE 25 MG tablet  Commonly known as:  APRESOLINE  Take 1 tablet (25 mg total) by mouth every 8  (eight) hours.     LORazepam 0.5 MG tablet  Commonly known as:  ATIVAN  Take 1-2 tablets (0.5-1 mg total) by mouth at bedtime as needed for anxiety.           Objective:   Physical Exam BP 122/80 mmHg  Pulse 71  Temp(Src) 98 F (36.7 C) (Oral)  Ht 6' 2.75" (1.899 m)  Wt 273 lb 4 oz (123.945 kg)  BMI 34.37 kg/m2  SpO2 97% General:   Well developed, well nourished . NAD.  HEENT:  Normocephalic . Face symmetric, atraumatic Lungs:  CTA B Normal respiratory effort, no intercostal retractions, no accessory muscle use. Heart: RRR,  no murmur.  No pretibial edema bilaterally  Skin: Not pale. Not jaundice Neurologic:  alert & oriented X3.  Speech normal, gait appropriate for age and unassisted Psych--  Cognition and judgment appear intact.  Cooperative with normal attention span and concentration.  Behavior appropriate. No anxious or depressed appearing.      Assessment & Plan:   Assessment> HTN Hyperlipidemia Anxiety-insomnia --  ativan Stroke 2015 (in the setting of elevated BP) Elevated LFTs : (-) serologies for hepatitis 2015 H/o EtOH  PLAN HTN: Seems well-controlled, continue with present care, check a CMP, CBC Hyperlipidemia: Refill medications, come back in 6 months fasting for labs and a physical exam Anxiety:  Refill Ativan, check a UDS and contract EtOH: Reports that is not a problem at this point Primary care: Had a flu shot at work. RTC 6 months CPX

## 2015-02-07 NOTE — Assessment & Plan Note (Signed)
HTN: Seems well-controlled, continue with present care, check a CMP, CBC Hyperlipidemia: Refill medications, come back in 6 months fasting for labs and a physical exam Anxiety:  Refill Ativan, check a UDS and contract EtOH: Reports that is not a problem at this point Primary care: Had a flu shot at work. RTC 6 months CPX

## 2015-02-08 LAB — HIV ANTIBODY (ROUTINE TESTING W REFLEX): HIV 1&2 Ab, 4th Generation: NONREACTIVE

## 2015-02-15 ENCOUNTER — Telehealth: Payer: Self-pay | Admitting: Internal Medicine

## 2015-02-15 NOTE — Telephone Encounter (Signed)
Pt was no show 02/06/15 4:00pm for follow up. Pt came in 02/07/15, Charge or no charge?

## 2015-02-15 NOTE — Telephone Encounter (Signed)
Charge, multiple no shows/cancellations.

## 2015-02-22 ENCOUNTER — Ambulatory Visit (INDEPENDENT_AMBULATORY_CARE_PROVIDER_SITE_OTHER): Payer: BLUE CROSS/BLUE SHIELD | Admitting: Physician Assistant

## 2015-02-22 ENCOUNTER — Encounter: Payer: Self-pay | Admitting: Physician Assistant

## 2015-02-22 VITALS — BP 122/68 | HR 97 | Temp 99.1°F | Ht 74.75 in | Wt 276.2 lb

## 2015-02-22 DIAGNOSIS — J069 Acute upper respiratory infection, unspecified: Secondary | ICD-10-CM | POA: Diagnosis not present

## 2015-02-22 DIAGNOSIS — B9789 Other viral agents as the cause of diseases classified elsewhere: Principal | ICD-10-CM

## 2015-02-22 MED ORDER — BENZONATATE 200 MG PO CAPS: 200.0000 mg | ORAL_CAPSULE | Freq: Two times a day (BID) | ORAL | Status: DC | PRN

## 2015-02-22 NOTE — Progress Notes (Signed)
Patient presents to clinic today c/o dry cough with fatigue and chills over the past 2.5 days. Symptoms started suddenly. Denies chest congestion, ear pain, tooth pain. Endorses low-grade fever last night only ~ 99. Denies recent travel. Denies sick contact.  Past Medical History  Diagnosis Date  . Hypertension 12/18/2011  . Hyperlipidemia 12/18/2011  . Stroke [facility]) 02-2014    Current Outpatient Prescriptions on File Prior to Visit  Medication Sig Dispense Refill  . aspirin EC 81 MG tablet Take 1 tablet (81 mg total) by mouth daily. 30 tablet 0  . carvedilol (COREG) 25 MG tablet Take 1 tablet (25 mg total) by mouth 2 (two) times daily with a meal. 60 tablet 6  . gemfibrozil (LOPID) 600 MG tablet Take 1 tablet (600 mg total) by mouth 2 (two) times daily before a meal. 180 tablet 1  . hydrALAZINE (APRESOLINE) 25 MG tablet Take 1 tablet (25 mg total) by mouth every 8 (eight) hours. 90 tablet 6  . LORazepam (ATIVAN) 0.5 MG tablet Take 1-2 tablets (0.5-1 mg total) by mouth at bedtime as needed for anxiety. 30 tablet 5   No current facility-administered medications on file prior to visit.    No Known Allergies  Family History  Problem Relation Age of Onset  . Hypertension Mother   . Alcoholism Father     Social History   Social History  . Marital Status: Married    Spouse Name: N/A  . Number of Children: N/A  . Years of Education: N/A   Occupational History  . banker    Social History Main Topics  . Smoking status: Former Research scientist (life sciences)  . Smokeless tobacco: None     Comment: d/c cigars 2013  . Alcohol Use: Yes     Comment: socially   . Drug Use: No  . Sexual Activity: No   Other Topics Concern  . None   Social History Narrative      Moved to the area ~ 2011, from [place] - See HPI.  All other ROS are negative.  BP 122/68 mmHg  Pulse 97  Temp(Src) 99.1 F (37.3 C) (Oral)  Ht 6' 2.75" (1.899 m)  Wt 276 lb 3.2 oz (125.283 kg)  BMI  34.74 kg/m2  SpO2 97%  Physical Exam  Constitutional: He is oriented to person, place, and time and well-developed, well-nourished, and in no distress.  HENT:  Head: Normocephalic and atraumatic.  Right Ear: External ear normal.  Left Ear: External ear normal.  Nose: Nose normal.  Mouth/Throat: Oropharynx is clear and moist. No oropharyngeal exudate.  TM within normal limits bilaterally.  Eyes: Conjunctivae are normal.  Neck: Neck supple.  Cardiovascular: Normal rate, regular rhythm, normal heart sounds and intact distal pulses.   Pulmonary/Chest: Effort normal and breath sounds normal. No respiratory distress. He has no wheezes. He has no rales. He exhibits no tenderness.  Neurological: He is alert and oriented to person, place, and time.  Skin: Skin is warm and dry. No rash noted.  Psychiatric: Affect normal.  Vitals reviewed.   Recent Results (from the past 2160 hour(s))  Comp Met (CMET)     Status: Abnormal   Collection Time: 02/07/15 11:55 AM  Result Value Ref Range   Sodium 138 135 - 145 mEq/L   Potassium 3.7 3.5 - 5.1 mEq/L   Chloride 106 96 - 112 mEq/L   CO2 23 19 - 32 mEq/L   Glucose,  Bld 95 70 - 99 mg/dL   BUN 15 6 - 23 mg/dL   Creatinine, Ser 1.11 0.40 - 1.50 mg/dL   Total Bilirubin 0.6 0.2 - 1.2 mg/dL   Alkaline Phosphatase 33 (L) 39 - 117 U/L   AST 38 (H) 0 - 37 U/L   ALT 30 0 - 53 U/L   Total Protein 7.7 6.0 - 8.3 g/dL   Albumin 4.5 3.5 - 5.2 g/dL   Calcium 9.7 8.4 - 10.5 mg/dL   GFR 92.44 >60.00 mL/min  CBC with Differential/Platelet     Status: None   Collection Time: 02/07/15 11:55 AM  Result Value Ref Range   WBC 7.3 4.0 - 10.5 K/uL   RBC 4.52 4.22 - 5.81 Mil/uL   Hemoglobin 13.8 13.0 - 17.0 g/dL   HCT 42.3 39.0 - 52.0 %   MCV 93.4 78.0 - 100.0 fl   MCHC 32.6 30.0 - 36.0 g/dL   RDW 12.8 11.5 - 15.5 %   Platelets 300.0 150.0 - 400.0 K/uL   Neutrophils Relative % 53.9 43.0 - 77.0 %   Lymphocytes Relative 32.7 12.0 - 46.0 %   Monocytes Relative 9.2  3.0 - 12.0 %   Eosinophils Relative 3.5 0.0 - 5.0 %   Basophils Relative 0.7 0.0 - 3.0 %   Neutro Abs 3.9 1.4 - 7.7 K/uL   Lymphs Abs 2.4 0.7 - 4.0 K/uL   Monocytes Absolute 0.7 0.1 - 1.0 K/uL   Eosinophils Absolute 0.3 0.0 - 0.7 K/uL   Basophils Absolute 0.1 0.0 - 0.1 K/uL  HIV antibody (with reflex)     Status: None   Collection Time: 02/07/15 11:55 AM  Result Value Ref Range   HIV 1&2 Ab, 4th Generation NONREACTIVE NONREACTIVE    Comment:   HIV-1 antigen and HIV-1/HIV-2 antibodies were not detected.  There is no laboratory evidence of HIV infection.   HIV-1/2 Antibody Diff        Not indicated. HIV-1 RNA, Qual TMA          Not indicated.     PLEASE NOTE: This information has been disclosed to you from records whose confidentiality may be protected by state law. If your state requires such protection, then the state law prohibits you from making any further disclosure of the information without the specific written consent of the person to whom it pertains, or as otherwise permitted by law. A general authorization for the release of medical or other information is NOT sufficient for this purpose.   The performance of this assay has not been clinically validated in patients less than 72 years old.   For additional information please refer to http://education.questdiagnostics.com/faq/FAQ106.  (This link is being provided for informational/educational purposes only.)       Assessment/Plan: Viral URI with cough Increase fluids. Tylenol for sore throat or aches. Humidifier in bedroom. Continue Multivitamin. Rest. Rx Tessalon for cough. Call or return if symptoms not improving over next 72 hours.

## 2015-02-22 NOTE — Progress Notes (Signed)
Pre visit review using our clinic review tool, if applicable. No additional management support is needed unless otherwise documented below in the visit note. 

## 2015-02-22 NOTE — Assessment & Plan Note (Signed)
Increase fluids. Tylenol for sore throat or aches. Humidifier in bedroom. Continue Multivitamin. Rest. Rx Tessalon for cough. Call or return if symptoms not improving over next 72 hours.

## 2015-02-22 NOTE — Patient Instructions (Signed)
Please stay well hydrated and get plenty of rest. Use the cough medication as directed and continue your multivitamin. Place a humidifier in the bedroom. Symptoms should resolve over the next 3-4 days. Usually day 2-3 of a viral URI is the worst. Your body will fight this off.

## 2015-03-22 ENCOUNTER — Ambulatory Visit: Payer: BLUE CROSS/BLUE SHIELD | Admitting: Medical

## 2015-03-22 ENCOUNTER — Ambulatory Visit: Payer: BLUE CROSS/BLUE SHIELD | Admitting: Physician Assistant

## 2015-03-22 ENCOUNTER — Encounter: Payer: BLUE CROSS/BLUE SHIELD | Admitting: Medical

## 2015-03-22 NOTE — Progress Notes (Signed)
This encounter was created in error - please disregard.

## 2015-03-29 ENCOUNTER — Telehealth: Payer: Self-pay

## 2015-03-29 ENCOUNTER — Encounter: Payer: BLUE CROSS/BLUE SHIELD | Admitting: Medical

## 2015-03-29 NOTE — Telephone Encounter (Signed)
Pt was No Show on 03/29/15. Charge for this no show.

## 2015-03-29 NOTE — Progress Notes (Signed)
This encounter was created in error - please disregard.

## 2015-03-30 ENCOUNTER — Encounter: Payer: Self-pay | Admitting: Medical

## 2015-03-30 NOTE — Telephone Encounter (Signed)
Marked to charge and mailing letter °

## 2015-06-19 ENCOUNTER — Telehealth: Payer: Self-pay | Admitting: *Deleted

## 2015-06-19 NOTE — Telephone Encounter (Signed)
Unable to reach patient at time of pre-visit call. Left message for patient to return call when available.  

## 2015-06-20 ENCOUNTER — Telehealth: Payer: Self-pay | Admitting: Internal Medicine

## 2015-06-20 ENCOUNTER — Encounter: Payer: BLUE CROSS/BLUE SHIELD | Admitting: Internal Medicine

## 2015-06-21 NOTE — Telephone Encounter (Signed)
Pt was no show 06/20/15 3:00pm for cpe, 4th no show w/in 12 months + multiple cancellations, charge or no charge?

## 2015-06-21 NOTE — Telephone Encounter (Signed)
Dismissal letter printed, signed and given to [place].

## 2015-06-21 NOTE — Telephone Encounter (Signed)
4 no shows, ok dismiss

## 2015-06-21 NOTE — Telephone Encounter (Signed)
Please advise, would you like to begin dismissal process?

## 2015-06-23 ENCOUNTER — Telehealth: Payer: Self-pay | Admitting: Internal Medicine

## 2015-06-23 NOTE — Telephone Encounter (Signed)
Patient dismissed from [facility] by Willow OraJose Paz MD , effective June 21, 2015. Dismissal letter sent out by certified / registered mail.  DAJ  Received signed domestic return receipt verifying delivery of certified letter on July 07, 2015. Article number [id number] DAJ

## 2015-08-09 ENCOUNTER — Encounter: Payer: BLUE CROSS/BLUE SHIELD | Admitting: Internal Medicine

## 2015-11-08 ENCOUNTER — Ambulatory Visit: Payer: Self-pay

## 2016-04-06 ENCOUNTER — Other Ambulatory Visit: Payer: Self-pay | Admitting: Internal Medicine

## 2016-08-27 ENCOUNTER — Ambulatory Visit (INDEPENDENT_AMBULATORY_CARE_PROVIDER_SITE_OTHER): Payer: BLUE CROSS/BLUE SHIELD

## 2016-08-27 ENCOUNTER — Ambulatory Visit (HOSPITAL_COMMUNITY)
Admission: EM | Admit: 2016-08-27 | Discharge: 2016-08-27 | Disposition: A | Discharge status: Home / Self Care | Payer: BLUE CROSS/BLUE SHIELD | Attending: Family Medicine | Admitting: Family Medicine

## 2016-08-27 ENCOUNTER — Encounter (HOSPITAL_COMMUNITY): Payer: Self-pay | Admitting: Emergency Medicine

## 2016-08-27 DIAGNOSIS — R52 Pain, unspecified: Secondary | ICD-10-CM | POA: Diagnosis not present

## 2016-08-27 DIAGNOSIS — M25552 Pain in left hip: Secondary | ICD-10-CM

## 2016-08-27 DIAGNOSIS — S76012A Strain of muscle, fascia and tendon of left hip, initial encounter: Secondary | ICD-10-CM

## 2016-08-27 LAB — POCT URINALYSIS DIP (DEVICE)
BILIRUBIN URINE: NEGATIVE
Glucose, UA: NEGATIVE mg/dL
Hgb urine dipstick: NEGATIVE
KETONES UR: NEGATIVE mg/dL
Leukocytes, UA: NEGATIVE
Nitrite: NEGATIVE
PH: 5.5 (ref 5.0–8.0)
Protein, ur: NEGATIVE mg/dL
SPECIFIC GRAVITY, URINE: 1.02 (ref 1.005–1.030)
Urobilinogen, UA: 0.2 mg/dL (ref 0.0–1.0)

## 2016-08-27 MED ORDER — CYCLOBENZAPRINE HCL 10 MG PO TABS: 10.0000 mg | ORAL_TABLET | Freq: Two times a day (BID) | ORAL | 0 refills | Status: DC | PRN

## 2016-08-27 MED ORDER — CARVEDILOL 25 MG PO TABS: 25.0000 mg | ORAL_TABLET | Freq: Two times a day (BID) | ORAL | 6 refills | Status: DC

## 2016-08-27 MED ORDER — HYDRALAZINE HCL 25 MG PO TABS: 25.0000 mg | ORAL_TABLET | Freq: Three times a day (TID) | ORAL | 6 refills | Status: DC

## 2016-08-27 MED ORDER — NAPROXEN 500 MG PO TABS
500.0000 mg | ORAL_TABLET | Freq: Two times a day (BID) | ORAL | 0 refills | Status: DC
Start: 2016-08-27 — End: 2019-10-15

## 2016-08-27 NOTE — ED Triage Notes (Signed)
Left hip, groin pain for a month, feels like a muscle pull.    Needs refill of blood pressure medicine, dr pas no longer available

## 2016-08-27 NOTE — ED Provider Notes (Signed)
CSN: [id number]     Arrival date & time 08/27/16  1052 History   None    Chief Complaint  Patient presents with  . Medication Refill  . Groin Pain   (Consider location/radiation/quality/duration/timing/severity/associated sxs/prior Treatment) Patient c/o left hip, left groin, and pelvic pain for a month.  He is active lifting weights.  He has difficulty with walking because of the pain.  He has hx of HTN and needs refill on bp meds.   The history is provided by the patient.  Medication Refill  Groin Pain     Past Medical History:  Diagnosis Date  . Hyperlipidemia 12/18/2011  . Hypertension 12/18/2011  . Stroke [facility]) 02-2014   Past Surgical History:  Procedure Laterality Date  . NO PAST SURGERIES     Family History  Problem Relation Age of Onset  . Hypertension Mother   . Alcoholism Father    Social History  Substance Use Topics  . Smoking status: Former Games developer  . Smokeless tobacco: Not on file     Comment: d/c cigars 2013  . Alcohol use Yes     Comment: socially     Review of Systems  Constitutional: Negative.   HENT: Negative.   Eyes: Negative.   Respiratory: Negative.   Cardiovascular: Negative.   Gastrointestinal: Negative.   Endocrine: Negative.   Genitourinary: Negative.   Musculoskeletal: Positive for arthralgias.  Allergic/Immunologic: Negative.   Neurological: Negative.   Hematological: Negative.   Psychiatric/Behavioral: Negative.     Allergies  Patient has no known allergies.  Home Medications   Prior to Admission medications   Medication Sig Start Date End Date Taking? Authorizing Provider  aspirin EC 81 MG tablet Take 1 tablet (81 mg total) by mouth daily. 02/20/14   Dhungel, Theda Belfast, MD  benzonatate (TESSALON) 200 MG capsule Take 1 capsule (200 mg total) by mouth 2 (two) times daily as needed for cough. 02/22/15   Waldon Merl, PA-C  carvedilol (COREG) 25 MG tablet Take 1 tablet (25 mg total) by mouth 2 (two) times daily with a meal.  08/27/16   Jolin Benavides, Anselm Pancoast, FNP  cyclobenzaprine (FLEXERIL) 10 MG tablet Take 1 tablet (10 mg total) by mouth 2 (two) times daily as needed for muscle spasms. 08/27/16   Deatra Canter, FNP  gemfibrozil (LOPID) 600 MG tablet Take 1 tablet (600 mg total) by mouth 2 (two) times daily before a meal. 02/07/15   Wanda Plump, MD  hydrALAZINE (APRESOLINE) 25 MG tablet Take 1 tablet (25 mg total) by mouth every 8 (eight) hours. 08/27/16   Deatra Canter, FNP  LORazepam (ATIVAN) 0.5 MG tablet Take 1-2 tablets (0.5-1 mg total) by mouth at bedtime as needed for anxiety. 02/07/15   Wanda Plump, MD  naproxen (NAPROSYN) 500 MG tablet Take 1 tablet (500 mg total) by mouth 2 (two) times daily with a meal. 08/27/16   Torrie Lafavor, Anselm Pancoast, FNP   Meds Ordered and Administered this Visit  Medications - No data to display  BP 113/79 (BP Location: Left Arm) Comment: out of one bp medicine  Pulse (!) 111   Temp 98.3 F (36.8 C) (Oral)   Resp 20   SpO2 97%  No data found.   Physical Exam  Constitutional: He appears well-developed and well-nourished.  HENT:  Head: Normocephalic and atraumatic.  Eyes: Conjunctivae and EOM are normal. Pupils are equal, round, and reactive to light.  Neck: Normal range of motion. Neck supple.  Cardiovascular: Normal rate, regular rhythm  and normal heart sounds.   Pulmonary/Chest: Effort normal and breath sounds normal.  Musculoskeletal: He exhibits tenderness.  TTP left hip and groin.  Tenderness with flexion, extension, inversion, and eversion of left hip.  Nursing note and vitals reviewed.   Urgent Care Course     Procedures (including critical care time)  Labs Review Labs Reviewed  POCT URINALYSIS DIP (DEVICE)    Imaging Review Dg Hip Unilat With Pelvis 2-3 Views Left  Result Date: 08/27/2016 CLINICAL DATA:  Pelvic/ left hip pain x1 month EXAM: DG HIP (WITH OR WITHOUT PELVIS) 2-3V LEFT COMPARISON:  CT abdomen/ pelvis dated 10/03/2011 FINDINGS: No fracture or  dislocation is seen. Bilateral hip joint spaces are preserved. Visualized bony pelvis appears intact the Stable benign bone island along the left iliac wing. IMPRESSION: Negative. Electronically Signed   By: Charline BillsSriyesh  Krishnan M.D.   On: 08/27/2016 12:50     Visual Acuity Review  Right Eye Distance:   Left Eye Distance:   Bilateral Distance:    Right Eye Near:   Left Eye Near:    Bilateral Near:         MDM   1. Left hip pain   2. Pain   3. Hip strain, left, initial encounter    Naprosyn 500mg  one po bid x 10 days #20 Flexeril 10 mg one po bid prn #20  BP meds were refilled and referral to PCP made.      Deatra CanterOxford, Hetty Linhart J, FNP 08/27/16 1321

## 2017-10-22 ENCOUNTER — Ambulatory Visit (HOSPITAL_COMMUNITY)
Admission: EM | Admit: 2017-10-22 | Discharge: 2017-10-22 | Disposition: A | Discharge status: Home / Self Care | Payer: BLUE CROSS/BLUE SHIELD | Attending: Family Medicine | Admitting: Family Medicine

## 2017-10-22 ENCOUNTER — Other Ambulatory Visit: Payer: Self-pay

## 2017-10-22 ENCOUNTER — Encounter (HOSPITAL_COMMUNITY): Payer: Self-pay | Admitting: Emergency Medicine

## 2017-10-22 DIAGNOSIS — Z7982 Long term (current) use of aspirin: Secondary | ICD-10-CM | POA: Insufficient documentation

## 2017-10-22 DIAGNOSIS — Z79899 Other long term (current) drug therapy: Secondary | ICD-10-CM | POA: Insufficient documentation

## 2017-10-22 DIAGNOSIS — Z760 Encounter for issue of repeat prescription: Secondary | ICD-10-CM

## 2017-10-22 DIAGNOSIS — R05 Cough: Secondary | ICD-10-CM

## 2017-10-22 DIAGNOSIS — E785 Hyperlipidemia, unspecified: Secondary | ICD-10-CM

## 2017-10-22 DIAGNOSIS — I10 Essential (primary) hypertension: Secondary | ICD-10-CM

## 2017-10-22 DIAGNOSIS — Z8673 Personal history of transient ischemic attack (TIA), and cerebral infarction without residual deficits: Secondary | ICD-10-CM | POA: Insufficient documentation

## 2017-10-22 DIAGNOSIS — Z8249 Family history of ischemic heart disease and other diseases of the circulatory system: Secondary | ICD-10-CM | POA: Insufficient documentation

## 2017-10-22 DIAGNOSIS — R0781 Pleurodynia: Secondary | ICD-10-CM

## 2017-10-22 LAB — CBC WITH DIFFERENTIAL/PLATELET
Abs Immature Granulocytes: 0 10*3/uL (ref 0.0–0.1)
BASOS ABS: 0.1 10*3/uL (ref 0.0–0.1)
BASOS PCT: 1 %
EOS ABS: 0.1 10*3/uL (ref 0.0–0.7)
EOS PCT: 1 %
HEMATOCRIT: 48.9 % (ref 39.0–52.0)
Hemoglobin: 16.2 g/dL (ref 13.0–17.0)
Immature Granulocytes: 0 %
Lymphocytes Relative: 31 %
Lymphs Abs: 2.6 10*3/uL (ref 0.7–4.0)
MCH: 31.8 pg (ref 26.0–34.0)
MCHC: 33.1 g/dL (ref 30.0–36.0)
MCV: 95.9 fL (ref 78.0–100.0)
Monocytes Absolute: 0.8 10*3/uL (ref 0.1–1.0)
Monocytes Relative: 9 %
NEUTROS PCT: 58 %
Neutro Abs: 4.8 10*3/uL (ref 1.7–7.7)
PLATELETS: 280 10*3/uL (ref 150–400)
RBC: 5.1 MIL/uL (ref 4.22–5.81)
RDW: 12.6 % (ref 11.5–15.5)
WBC: 8.4 10*3/uL (ref 4.0–10.5)

## 2017-10-22 LAB — COMPREHENSIVE METABOLIC PANEL
ALT: 65 U/L — ABNORMAL HIGH (ref 0–44)
ANION GAP: 13 (ref 5–15)
AST: 88 U/L — AB (ref 15–41)
Albumin: 4.4 g/dL (ref 3.5–5.0)
Alkaline Phosphatase: 49 U/L (ref 38–126)
BILIRUBIN TOTAL: 1 mg/dL (ref 0.3–1.2)
BUN: 14 mg/dL (ref 6–20)
CHLORIDE: 105 mmol/L (ref 98–111)
CO2: 23 mmol/L (ref 22–32)
Calcium: 9.3 mg/dL (ref 8.9–10.3)
Creatinine, Ser: 1.34 mg/dL — ABNORMAL HIGH (ref 0.61–1.24)
GFR calc Af Amer: >60 mL/min (ref >60)
GFR calc non Af Amer: >60 mL/min (ref >60)
Glucose, Bld: 102 mg/dL — ABNORMAL HIGH (ref 70–99)
POTASSIUM: 3.8 mmol/L (ref 3.5–5.1)
SODIUM: 141 mmol/L (ref 135–145)
TOTAL PROTEIN: 7.7 g/dL (ref 6.5–8.1)

## 2017-10-22 MED ORDER — GEMFIBROZIL 600 MG PO TABS: 600.0000 mg | ORAL_TABLET | Freq: Two times a day (BID) | ORAL | 0 refills | Status: DC

## 2017-10-22 MED ORDER — CARVEDILOL 25 MG PO TABS: 25.0000 mg | ORAL_TABLET | Freq: Two times a day (BID) | ORAL | 0 refills | Status: DC

## 2017-10-22 NOTE — ED Triage Notes (Addendum)
Pt here for refill of medications:  Gemfibrozil 600mg  BID Coreg 25mg  BID  Pt would also like to have labs drawn to check his liver and kidney function.  Pt has new insurance that begins 11/16/17 and he will set up primary care when he has it.

## 2017-10-22 NOTE — Discharge Instructions (Signed)
Medications refilled for 90 days.  Lab work drawn, you will be contacted with the results.  Follow-up with PCP for further evaluation and management of chronic diseases.  Start ibuprofen 800 mg 3 times a day for your left-sided pain.  Warm compresses to the location.  If experiencing worsening symptoms, fever, chest pain, shortness of breath, palpitation, wheezing, nausea/vomiting follow-up for reevaluation.

## 2017-10-22 NOTE — ED Provider Notes (Signed)
[facility]    CSN: [id number] Arrival date & time: 10/22/17  1228     History   Chief Complaint Chief Complaint  Patient presents with  . Medication Refill    HPI Tony Walker is a 47 y.o. male.   47 year old male comes in for medication refill.  He would like to get Coreg, Lopid refill.  Would also like to get kidney function, liver function checked.  States that he does not currently have a primary care doctor, and new insurance will start next month.  He denies chest pain, shortness of breath, wheezing, palpitations.  States has had some left-sided rib pain that is worse with cough, and worse with movement.  Denies fever, chills, night sweats.  Denies urinary symptoms such as frequency, dysuria, hematuria.  Denies abdominal pain, nausea, vomiting.     Past Medical History:  Diagnosis Date  . Hyperlipidemia 12/18/2011  . Hypertension 12/18/2011  . Stroke [facility]) 02-2014    Patient Active Problem List   Diagnosis Date Noted  . Viral URI with cough 02/22/2015  . PCP NOTES >>>>> 02/07/2015  . Renal insufficiency 02/21/2014  . Abdominal pain, right upper quadrant 02/21/2014  . Transaminitis 02/20/2014  . Acute CVA (cerebrovascular accident) (HCC) 02/20/2014  . ETOH abuse 02/20/2014  . AKI (acute kidney injury) (HCC) 02/18/2014  . Other malaise and fatigue 10/27/2013  . Gingivitis 10/14/2013  . Hypertension 12/18/2011  . Hyperlipidemia 12/18/2011  . Abnormal LFTs 10/03/2011    Past Surgical History:  Procedure Laterality Date  . NO PAST SURGERIES         Home Medications    Prior to Admission medications   Medication Sig Start Date End Date Taking? Authorizing Provider  aspirin EC 81 MG tablet Take 1 tablet (81 mg total) by mouth daily. 02/20/14  Yes Dhungel, Nishant, MD  carvedilol (COREG) 25 MG tablet Take 1 tablet (25 mg total) by mouth 2 (two) times daily with a meal. 10/22/17 01/20/18  Cathie Hoops, Tiffannie Sloss V, PA-C  gemfibrozil (LOPID) 600 MG tablet Take 1 tablet  (600 mg total) by mouth 2 (two) times daily before a meal. 10/22/17 01/20/18  Cathie Hoops, Tiffony Kite V, PA-C  hydrALAZINE (APRESOLINE) 25 MG tablet Take 1 tablet (25 mg total) by mouth every 8 (eight) hours. 08/27/16   Deatra Canter, FNP  LORazepam (ATIVAN) 0.5 MG tablet Take 1-2 tablets (0.5-1 mg total) by mouth at bedtime as needed for anxiety. 02/07/15   Wanda Plump, MD  naproxen (NAPROSYN) 500 MG tablet Take 1 tablet (500 mg total) by mouth 2 (two) times daily with a meal. 08/27/16   Oxford, Anselm Pancoast, FNP    Family History Family History  Problem Relation Age of Onset  . Hypertension Mother   . Alcoholism Father     Social History Social History   Tobacco Use  . Smoking status: Never Smoker  . Smokeless tobacco: Current User    Types: Chew  . Tobacco comment: d/c cigars 2013  Substance Use Topics  . Alcohol use: Yes    Comment: socially   . Drug use: No     Allergies   Patient has no known allergies.   Review of Systems Review of Systems  Reason unable to perform ROS: See HPI as above.     Physical Exam Triage Vital Signs ED Triage Vitals  Enc Vitals Group     BP 10/22/17 1250 (!) 149/97     Pulse Rate 10/22/17 1250 99  Resp --      Temp 10/22/17 1250 98.3 F (36.8 C)     Temp Source 10/22/17 1250 Oral     SpO2 10/22/17 1250 99 %     Weight --      Height --      Head Circumference --      Peak Flow --      Pain Score 10/22/17 1252 0     Pain Loc --      Pain Edu? --      Excl. in GC? --    No data found.  Updated Vital Signs BP (!) 149/97 (BP Location: Left Arm)   Pulse 99   Temp 98.3 F (36.8 C) (Oral)   SpO2 99%   Physical Exam  Constitutional: He is oriented to person, place, and time. He appears well-developed and well-nourished. No distress.  HENT:  Head: Normocephalic and atraumatic.  Cardiovascular: Normal rate, regular rhythm and normal heart sounds. Exam reveals no gallop and no friction rub.  No murmur heard. Pulmonary/Chest: Effort normal  and breath sounds normal. No stridor. No respiratory distress. He has no wheezes. He has no rales.  Mild tenderness to palpation of left lower rib  Abdominal: Soft. Bowel sounds are normal. He exhibits no mass. There is no tenderness. There is no rebound and no guarding.  Neurological: He is alert and oriented to person, place, and time.  Skin: Skin is warm and dry.  Psychiatric: He has a normal mood and affect. His behavior is normal. Judgment normal.    UC Treatments / Results  Labs (all labs ordered are listed, but only abnormal results are displayed) Labs Reviewed  CBC WITH DIFFERENTIAL/PLATELET  COMPREHENSIVE METABOLIC PANEL    EKG None  Radiology No results found.  Procedures Procedures (including critical care time)  Medications Ordered in UC Medications - No data to display  Initial Impression / Assessment and Plan / UC Course  I have reviewed the triage vital signs and the nursing notes.  Pertinent labs & imaging results that were available during my care of the patient were reviewed by me and considered in my medical decision making (see chart for details).    Medications refilled for 90 days. CBC/CMP drawn, pending results. Resources provided for PCP. Patient to establish PCP care for further evaluation and management of chronic diseases.   Patient to start NSAIDs for possible MSK pain. Return precautions given. Patient expresses understanding and agrees to plan.  Final Clinical Impressions(s) / UC Diagnoses   Final diagnoses:  Medication refill  Rib pain on left side    ED Prescriptions    Medication Sig Dispense Auth. Provider   gemfibrozil (LOPID) 600 MG tablet Take 1 tablet (600 mg total) by mouth 2 (two) times daily before a meal. 180 tablet Lanie Schelling V, PA-C   carvedilol (COREG) 25 MG tablet Take 1 tablet (25 mg total) by mouth 2 (two) times daily with a meal. 180 tablet Threasa AlphaYu, Robina Hamor V, PA-C        Fortunato Nordin V, [place] JerseyPA-C 10/22/17 1916

## 2017-10-28 ENCOUNTER — Telehealth (HOSPITAL_COMMUNITY): Payer: Self-pay

## 2017-10-28 NOTE — Telephone Encounter (Signed)
Pt called regarding test results.  Creatinine, AST and ALT are slightly elevated. Per Dr. Tracie HarrierHagler patient should stay well hydrated and follow up for a recheck with us or his PCP.  Pt endorses mild abdominal pain. Encouraged patient to be seen for issue especially if it is worsening.  Pt verbalized understanding.

## 2017-12-19 ENCOUNTER — Encounter (HOSPITAL_COMMUNITY): Payer: Self-pay | Admitting: Emergency Medicine

## 2017-12-19 ENCOUNTER — Emergency Department (HOSPITAL_COMMUNITY): Payer: BLUE CROSS/BLUE SHIELD

## 2017-12-19 ENCOUNTER — Emergency Department (HOSPITAL_COMMUNITY)
Admission: EM | Admit: 2017-12-19 | Discharge: 2017-12-19 | Disposition: A | Discharge status: Home / Self Care | Payer: BLUE CROSS/BLUE SHIELD | Attending: Emergency Medicine | Admitting: Emergency Medicine

## 2017-12-19 DIAGNOSIS — Z8673 Personal history of transient ischemic attack (TIA), and cerebral infarction without residual deficits: Secondary | ICD-10-CM | POA: Diagnosis not present

## 2017-12-19 DIAGNOSIS — Z79899 Other long term (current) drug therapy: Secondary | ICD-10-CM | POA: Diagnosis not present

## 2017-12-19 DIAGNOSIS — I10 Essential (primary) hypertension: Secondary | ICD-10-CM | POA: Diagnosis not present

## 2017-12-19 DIAGNOSIS — Z7982 Long term (current) use of aspirin: Secondary | ICD-10-CM | POA: Diagnosis not present

## 2017-12-19 DIAGNOSIS — R1013 Epigastric pain: Secondary | ICD-10-CM | POA: Diagnosis present

## 2017-12-19 DIAGNOSIS — K852 Alcohol induced acute pancreatitis without necrosis or infection: Secondary | ICD-10-CM | POA: Insufficient documentation

## 2017-12-19 LAB — URINALYSIS, ROUTINE W REFLEX MICROSCOPIC
Bacteria, UA: NONE SEEN
Bilirubin Urine: NEGATIVE
GLUCOSE, UA: NEGATIVE mg/dL
Ketones, ur: NEGATIVE mg/dL
Leukocytes, UA: NEGATIVE
Nitrite: NEGATIVE
PH: 5 (ref 5.0–8.0)
Protein, ur: 30 mg/dL — AB
Specific Gravity, Urine: 1.021 (ref 1.005–1.030)

## 2017-12-19 LAB — COMPREHENSIVE METABOLIC PANEL
ALBUMIN: 3.4 g/dL — AB (ref 3.5–5.0)
ALK PHOS: 64 U/L (ref 38–126)
ALT: 143 U/L — ABNORMAL HIGH (ref 0–44)
AST: 238 U/L — AB (ref 15–41)
Anion gap: 10 (ref 5–15)
BUN: 6 mg/dL (ref 6–20)
CALCIUM: 9.1 mg/dL (ref 8.9–10.3)
CHLORIDE: 101 mmol/L (ref 98–111)
CO2: 24 mmol/L (ref 22–32)
Creatinine, Ser: 1.48 mg/dL — ABNORMAL HIGH (ref 0.61–1.24)
GFR calc non Af Amer: 55 mL/min — ABNORMAL LOW (ref >60)
GLUCOSE: 103 mg/dL — AB (ref 70–99)
Potassium: 3.3 mmol/L — ABNORMAL LOW (ref 3.5–5.1)
SODIUM: 135 mmol/L (ref 135–145)
Total Bilirubin: 2.1 mg/dL — ABNORMAL HIGH (ref 0.3–1.2)
Total Protein: 7 g/dL (ref 6.5–8.1)

## 2017-12-19 LAB — CBC
HCT: 39.1 % (ref 39.0–52.0)
Hemoglobin: 12.7 g/dL — ABNORMAL LOW (ref 13.0–17.0)
MCH: 32.8 pg (ref 26.0–34.0)
MCHC: 32.5 g/dL (ref 30.0–36.0)
MCV: 101 fL — AB (ref 78.0–100.0)
PLATELETS: 163 10*3/uL (ref 150–400)
RBC: 3.87 MIL/uL — AB (ref 4.22–5.81)
RDW: 12.9 % (ref 11.5–15.5)
WBC: 8.9 10*3/uL (ref 4.0–10.5)

## 2017-12-19 LAB — LIPASE, BLOOD: LIPASE: 5010 U/L — AB (ref 11–51)

## 2017-12-19 MED ORDER — MORPHINE SULFATE (PF) 4 MG/ML IV SOLN
4.0000 mg | Freq: Once | INTRAVENOUS | Status: AC
  Administered 2017-12-19: 4 mg via INTRAVENOUS
  Filled 2017-12-19: qty 1

## 2017-12-19 MED ORDER — ACETAMINOPHEN 500 MG PO TABS
1000.0000 mg | ORAL_TABLET | Freq: Once | ORAL | Status: AC
  Administered 2017-12-19: 1000 mg via ORAL
  Filled 2017-12-19: qty 2

## 2017-12-19 MED ORDER — IOHEXOL 300 MG/ML  SOLN
100.0000 mL | Freq: Once | INTRAMUSCULAR | Status: AC | PRN
  Administered 2017-12-19: 100 mL via INTRAVENOUS

## 2017-12-19 MED ORDER — OXYCODONE HCL 5 MG PO TABS
5.0000 mg | ORAL_TABLET | Freq: Once | ORAL | Status: AC
  Administered 2017-12-19: 5 mg via ORAL
  Filled 2017-12-19: qty 1

## 2017-12-19 MED ORDER — MORPHINE SULFATE 15 MG PO TABS: 15.0000 mg | ORAL_TABLET | ORAL | 0 refills | Status: DC | PRN

## 2017-12-19 NOTE — ED Provider Notes (Signed)
[facility] EMERGENCY DEPARTMENT Provider Note   CSN: [id number] Arrival date & time: 12/19/17  1644     History   Chief Complaint Chief Complaint  Patient presents with  . Abdominal Pain    HPI Tony Walker is a 47 y.o. male.  47 yo M with a chief complaint of epigastric abdominal pain.  Going on for the past 2 or 3 days.  Was seen in urgent care center and noted to have likely pancreatitis.  An outpatient CT scan was ordered for him.  Patient had worsening of his pain today.  Came to the ED for evaluation.  Patient was seen in quick look and had a CT scan ordered.  He feels mildly better by my assessment.  Still feels that his pain is somewhat significant.  He tried to drink something at home earlier today and that is when his pain really worsens.  The history is provided by the patient and the spouse.  Abdominal Pain   This is a new problem. The current episode started more than 2 days ago. The problem occurs constantly. The problem has been gradually worsening. The pain is associated with eating. The pain is located in the epigastric region. The quality of the pain is sharp and shooting. The pain is at a severity of 7/10. The pain is moderate. Associated symptoms include nausea and vomiting. Pertinent negatives include fever, diarrhea, headaches, arthralgias and myalgias. The symptoms are aggravated by eating. Nothing relieves the symptoms.    Past Medical History:  Diagnosis Date  . Hyperlipidemia 12/18/2011  . Hypertension 12/18/2011  . Stroke [facility]) 02-2014    Patient Active Problem List   Diagnosis Date Noted  . Viral URI with cough 02/22/2015  . PCP NOTES >>>>> 02/07/2015  . Renal insufficiency 02/21/2014  . Abdominal pain, right upper quadrant 02/21/2014  . Transaminitis 02/20/2014  . Acute CVA (cerebrovascular accident) (HCC) 02/20/2014  . ETOH abuse 02/20/2014  . AKI (acute kidney injury) (HCC) 02/18/2014  . Other malaise and fatigue 10/27/2013  .  Gingivitis 10/14/2013  . Hypertension 12/18/2011  . Hyperlipidemia 12/18/2011  . Abnormal LFTs 10/03/2011    Past Surgical History:  Procedure Laterality Date  . NO PAST SURGERIES          Home Medications    Prior to Admission medications   Medication Sig Start Date End Date Taking? Authorizing Provider  aspirin EC 81 MG tablet Take 1 tablet (81 mg total) by mouth daily. 02/20/14  Yes Dhungel, Nishant, MD  carvedilol (COREG) 25 MG tablet Take 1 tablet (25 mg total) by mouth 2 (two) times daily with a meal. 10/22/17 01/20/18 Yes Yu, Amy V, PA-C  famotidine (PEPCID) 20 MG tablet Take 20 mg by mouth 2 (two) times daily. 12/17/17  Yes [provider]  gemfibrozil (LOPID) 600 MG tablet Take 1 tablet (600 mg total) by mouth 2 (two) times daily before a meal. 10/22/17 01/20/18 Yes Yu, Amy V, PA-C  hydrALAZINE (APRESOLINE) 25 MG tablet Take 1 tablet (25 mg total) by mouth every 8 (eight) hours. 08/27/16  Yes Oxford, Anselm Pancoast, FNP  Multiple Vitamins-Minerals (ONE-A-DAY MENS HEALTH FORMULA) TABS Take by mouth.   Yes [provider]  ondansetron (ZOFRAN-ODT) 4 MG disintegrating tablet Take 4 mg by mouth every 8 (eight) hours as needed for nausea or vomiting (TO BE DISSOLVED IN THE MOUTH).  12/17/17  Yes [provider]  LORazepam (ATIVAN) 0.5 MG tablet Take 1-2 tablets (0.5-1 mg total) by mouth  at bedtime as needed for anxiety. Patient not taking: Reported on 12/19/2017 02/07/15   Wanda Plump, MD  morphine (MSIR) 15 MG tablet Take 1 tablet (15 mg total) by mouth every 4 (four) hours as needed for severe pain. 12/19/17   Melene Plan, DO  naproxen (NAPROSYN) 500 MG tablet Take 1 tablet (500 mg total) by mouth 2 (two) times daily with a meal. Patient not taking: Reported on 12/19/2017 08/27/16   Deatra Canter, FNP    Family History Family History  Problem Relation Age of Onset  . Hypertension Mother   . Alcoholism Father     Social History Social History   Tobacco Use    . Smoking status: Never Smoker  . Smokeless tobacco: Current User    Types: Chew  . Tobacco comment: d/c cigars 2013  Substance Use Topics  . Alcohol use: Yes    Comment: socially   . Drug use: No     Allergies   Patient has no known allergies.   Review of Systems Review of Systems  Constitutional: Positive for chills. Negative for fever.  HENT: Negative for congestion and facial swelling.   Eyes: Negative for discharge and visual disturbance.  Respiratory: Negative for shortness of breath.   Cardiovascular: Negative for chest pain and palpitations.  Gastrointestinal: Positive for abdominal pain, nausea and vomiting. Negative for diarrhea.  Musculoskeletal: Negative for arthralgias and myalgias.  Skin: Negative for color change and rash.  Neurological: Negative for tremors, syncope and headaches.  Psychiatric/Behavioral: Negative for confusion and dysphoric mood.     Physical Exam Updated Vital Signs BP (!) 145/94   Pulse 75   Temp 99.2 F (37.3 C) (Oral)   Resp (!) 9   Ht 6\' 3"  (1.905 m)   Wt 118.4 kg   SpO2 99%   BMI 32.62 kg/m   Physical Exam  Constitutional: He is oriented to person, place, and time. He appears well-developed and well-nourished.  HENT:  Head: Normocephalic and atraumatic.  Eyes: Pupils are equal, round, and reactive to light. EOM are normal.  Neck: Normal range of motion. Neck supple. No JVD present.  Cardiovascular: Normal rate and regular rhythm. Exam reveals no gallop and no friction rub.  No murmur heard. Pulmonary/Chest: No respiratory distress. He has no wheezes.  Abdominal: He exhibits no distension and no mass. There is tenderness (epigastric, negative murphys). There is no rebound and no guarding.  Musculoskeletal: Normal range of motion.  Neurological: He is alert and oriented to person, place, and time.  Skin: No rash noted. No pallor.  Psychiatric: He has a normal mood and affect. His behavior is normal.  Nursing note and  vitals reviewed.    ED Treatments / Results  Labs (all labs ordered are listed, but only abnormal results are displayed) Labs Reviewed  LIPASE, BLOOD - Abnormal; Notable for the following components:      Result Value   Lipase 5,010 (*)    All other components within normal limits  COMPREHENSIVE METABOLIC PANEL - Abnormal; Notable for the following components:   Potassium 3.3 (*)    Glucose, Bld 103 (*)    Creatinine, Ser 1.48 (*)    Albumin 3.4 (*)    AST 238 (*)    ALT 143 (*)    Total Bilirubin 2.1 (*)    GFR calc non Af Amer 55 (*)    All other components within normal limits  CBC - Abnormal; Notable for the following components:   RBC 3.87 (*)  Hemoglobin 12.7 (*)    MCV 101.0 (*)    All other components within normal limits  URINALYSIS, ROUTINE W REFLEX MICROSCOPIC - Abnormal; Notable for the following components:   Color, Urine AMBER (*)    APPearance HAZY (*)    Hgb urine dipstick SMALL (*)    Protein, ur 30 (*)    All other components within normal limits    EKG None  Radiology Ct Abdomen Pelvis W Contrast  Result Date: 12/19/2017 CLINICAL DATA:  Worsening abdominal pain nausea vomiting and loss of appetite EXAM: CT ABDOMEN AND PELVIS WITH CONTRAST TECHNIQUE: Multidetector CT imaging of the abdomen and pelvis was performed using the standard protocol following bolus administration of intravenous contrast. CONTRAST:  OMNIPAQUE IOHEXOL 300 MG/ML  SOLN COMPARISON:  10/03/2011 FINDINGS: Lower chest: Lung bases demonstrate no acute consolidation or effusion. The heart size is normal. Hepatobiliary: No focal liver abnormality is seen. No gallstones, gallbladder wall thickening, or biliary dilatation. Pancreas: Moderate to marked edema and fluid surrounding the pancreas consistent with acute pancreatitis. No hypoenhancement to suggest necrosis. No organized fluid collection. Spleen: Normal in size without focal abnormality. Adrenals/Urinary Tract: Adrenal glands are  unremarkable. Kidneys are normal, without renal calculi, focal lesion, or hydronephrosis. Bladder is unremarkable. Stomach/Bowel: Stomach is within normal limits. Appendix appears normal. No evidence of bowel wall thickening, distention, or inflammatory changes. Vascular/Lymphatic: Normal enhancement of portal and splenic veins. Nonaneurysmal aorta. No significantly enlarged lymph nodes. Reproductive: Prostate is unremarkable. Other: No free air Musculoskeletal: No acute or significant osseous findings. Degenerative changes at L5-S1. IMPRESSION: Findings consistent with acute pancreatitis. No evidence for organized pancreatic fluid collection or pancreatic necrosis at this time. Electronically Signed   By: Jasmine Pang M.D.   On: 12/19/2017 20:56    Procedures Procedures (including critical care time)  Medications Ordered in ED Medications  morphine 4 MG/ML injection 4 mg (4 mg Intravenous Given 12/19/17 2000)  iohexol (OMNIPAQUE) 300 MG/ML solution 100 mL (100 mLs Intravenous Contrast Given 12/19/17 2032)  acetaminophen (TYLENOL) tablet 1,000 mg (1,000 mg Oral Given 12/19/17 2138)  oxyCODONE (Oxy IR/ROXICODONE) immediate release tablet 5 mg (5 mg Oral Given 12/19/17 2138)     Initial Impression / Assessment and Plan / ED Course  I have reviewed the triage vital signs and the nursing notes.  Pertinent labs & imaging results that were available during my care of the patient were reviewed by me and considered in my medical decision making (see chart for details).     47 yo M with a chief complaint of epigastric abdominal pain.  Patient was found to have pancreatitis by labs and history and physical.  Lipase of 5100.  CT scan without any complications from pancreatitis.  This is likely alcohol induced as the patient has been having 3-4 drinks a day over a fairly long time.  He does have mild LFT elevation.  His gallbladder appears normal on CT scan.  He is feeling much better on reassessment.  He is  requesting to try go through this as an outpatient.  Will prescribe him a short course of pain medicine.  Have him take Tylenol around-the-clock.  Return precautions given.  10:02 PM:  I have discussed the diagnosis/risks/treatment options with the patient and family and believe the pt to be eligible for discharge home to follow-up with PCP. We also discussed returning to the ED immediately if new or worsening sx occur. We discussed the sx which are most concerning (e.g., sudden worsening pain, fever,  inability to tolerate by mouth) that necessitate immediate return. Medications administered to the patient during their visit and any new prescriptions provided to the patient are listed below.  Medications given during this visit Medications  morphine 4 MG/ML injection 4 mg (4 mg Intravenous Given 12/19/17 2000)  iohexol (OMNIPAQUE) 300 MG/ML solution 100 mL (100 mLs Intravenous Contrast Given 12/19/17 2032)  acetaminophen (TYLENOL) tablet 1,000 mg (1,000 mg Oral Given 12/19/17 2138)  oxyCODONE (Oxy IR/ROXICODONE) immediate release tablet 5 mg (5 mg Oral Given 12/19/17 2138)      The patient appears reasonably screen and/or stabilized for discharge and I doubt any other medical condition or other [facility] requiring further screening, evaluation, or treatment in the ED at this time prior to discharge.    Final Clinical Impressions(s) / ED Diagnoses   Final diagnoses:  Alcohol-induced acute pancreatitis without infection or necrosis    ED Discharge Orders         Ordered    morphine (MSIR) 15 MG tablet  Every 4 hours PRN     12/19/17 2157           Melene Plan, DO 12/19/17 2202

## 2017-12-19 NOTE — Discharge Instructions (Addendum)
Take tylenol 1000mg (2 extra strength) four times a day.   Then take the pain medicine if you feel like you need it. Narcotics do not help with the pain, they only make you care about it less.  You can become addicted to this, people may break into your house to steal it.  It will constipate you.  If you drive under the influence of this medicine you can get a DUI.    Clear liquids for a day, followed by liquid soups and broth, then BRAT(bananas rice applesauce and toast)

## 2017-12-19 NOTE — ED Notes (Signed)
Pt. Stated that he wanted something for pain and asked triage nurse. Pt has not received anything for pain.

## 2017-12-19 NOTE — ED Triage Notes (Signed)
Patient to ED c/o worsening upper abdominal pain today - reports pain, N/V, loss of appetite, and chills x 2 weeks. Saw PCP Wednesday for blood work and diagnosed with pancreatitis (scheduled for CT next week).

## 2019-02-12 IMAGING — CT CT ABD-PELV W/ CM
2 of 5 series · 16 of 46 positions shown, 18 images · IV contrast (APPLIED)
Comparison: 10/03/2011

CLINICAL DATA: Worsening abdominal pain nausea vomiting and loss of
appetite

EXAM:
CT ABDOMEN AND PELVIS WITH CONTRAST
TECHNIQUE: Multidetector CT imaging of the abdomen and pelvis was performed
using the standard protocol following bolus administration of
intravenous contrast.
CONTRAST:  100mL OMNIPAQUE IOHEXOL 300 MG/ML  SOLN

[Series 3: abdomen 5.0 · axial · 0.98mm/px · z∈[+840,+1295]mm · 13 of 106 slices shown, 15 images]
[im 8/106  soft-tissue]
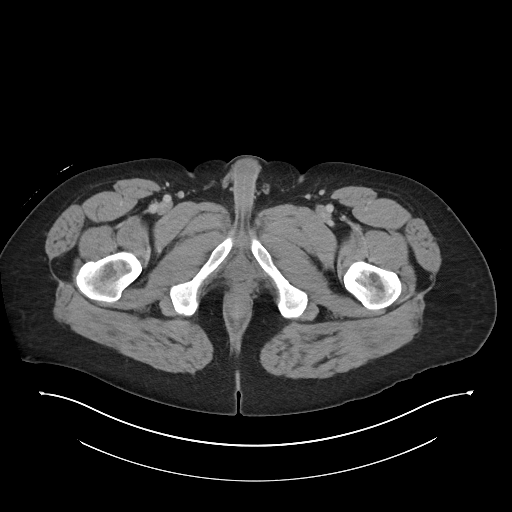
[im 8/106  bone]
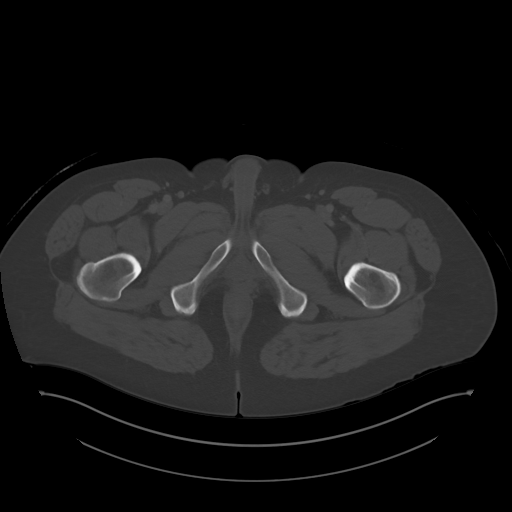
[im 15/106  soft-tissue]
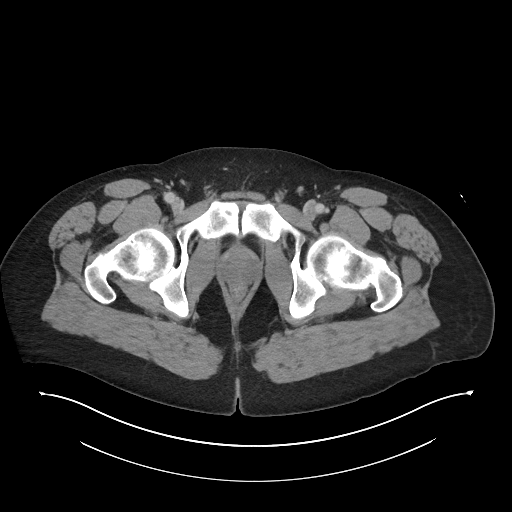
[im 22/106  soft-tissue]
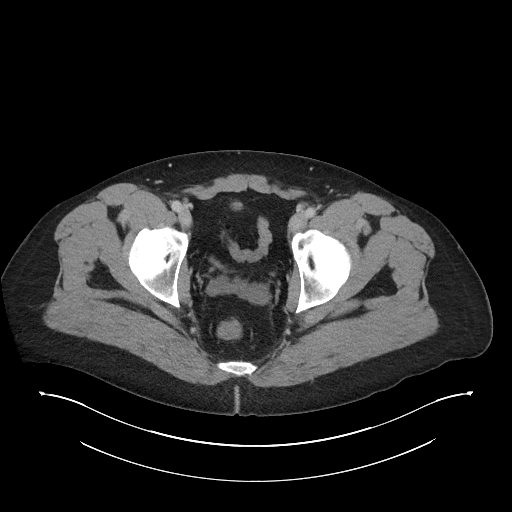
[im 29/106  soft-tissue]
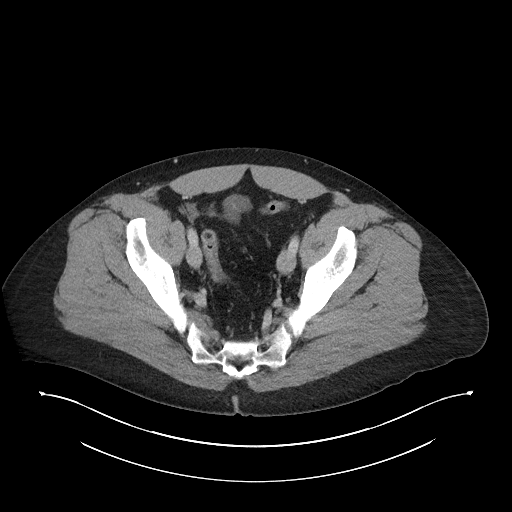
[im 36/106  soft-tissue]
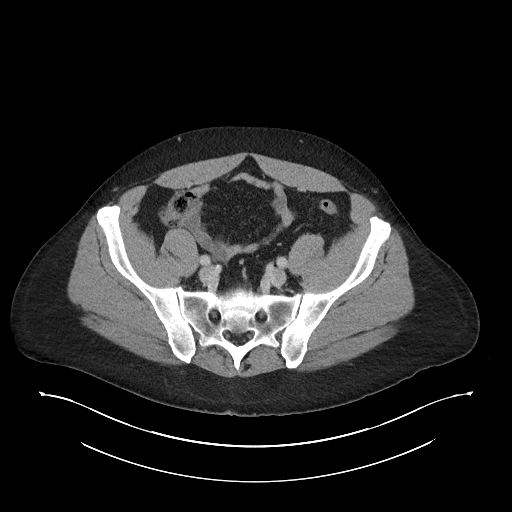
[im 43/106  soft-tissue]
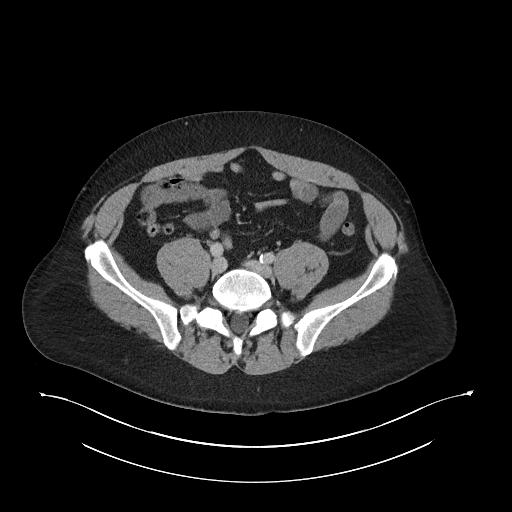
[im 57/106  soft-tissue]
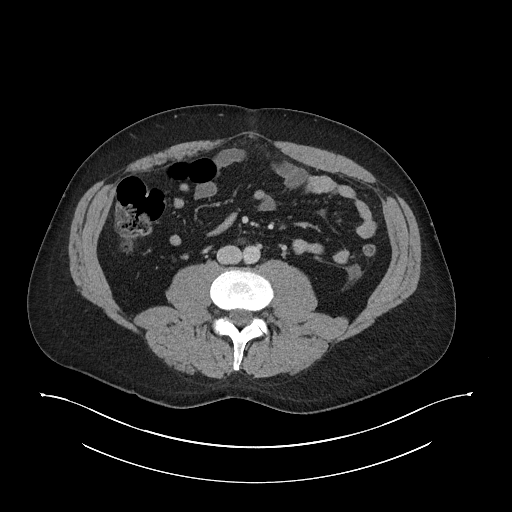
[im 64/106  soft-tissue]
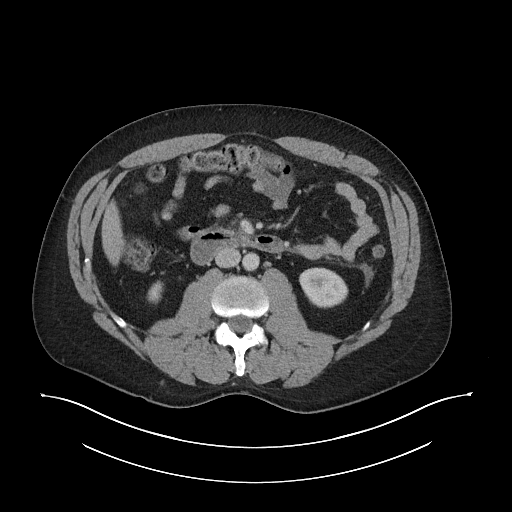
[im 71/106  soft-tissue]
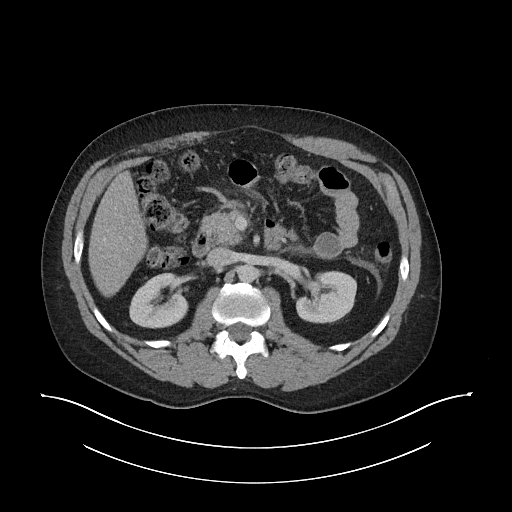
[im 71/106  bone]
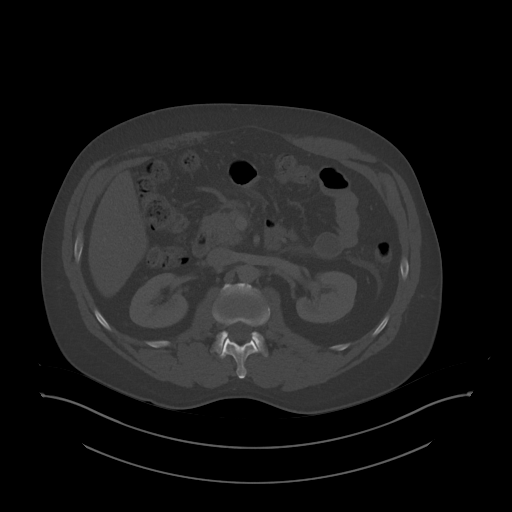
[im 78/106  soft-tissue]
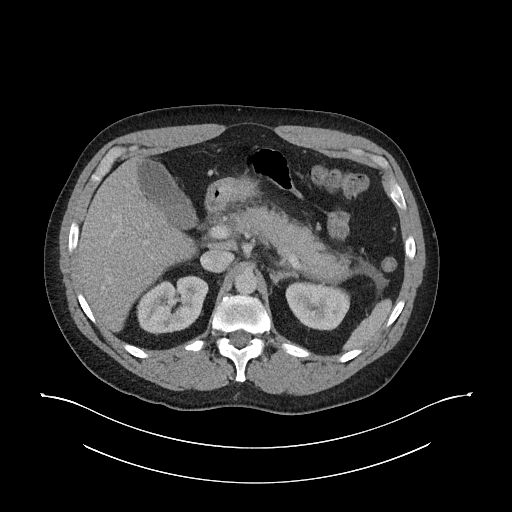
[im 85/106  soft-tissue]
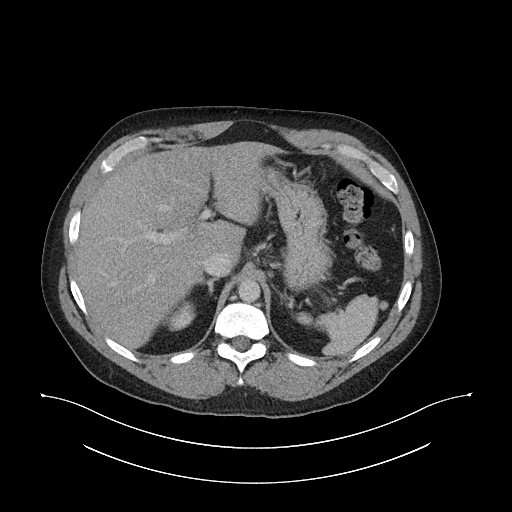
[im 92/106  soft-tissue]
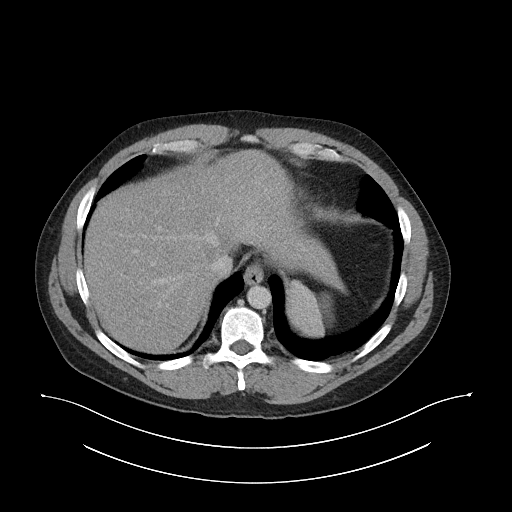
[im 99/106  soft-tissue]
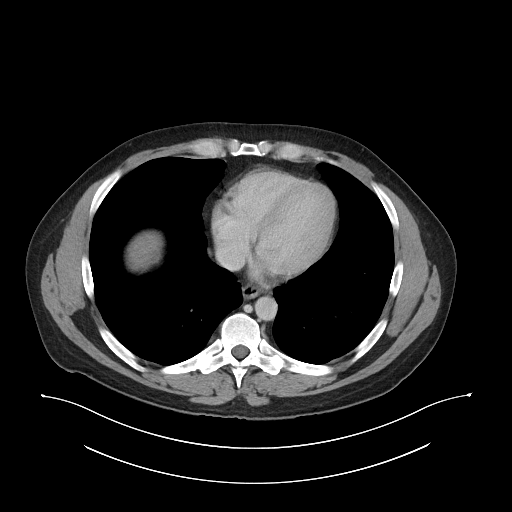

[Series 6: abdomen 3.0 mpr cor · coronal · 0.85mm/px · 3 of 101 slices shown]
[im 34/101  soft-tissue]
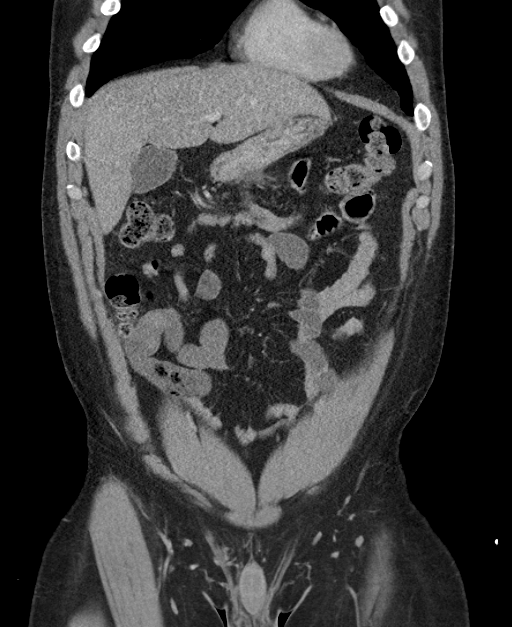
[im 45/101  soft-tissue]
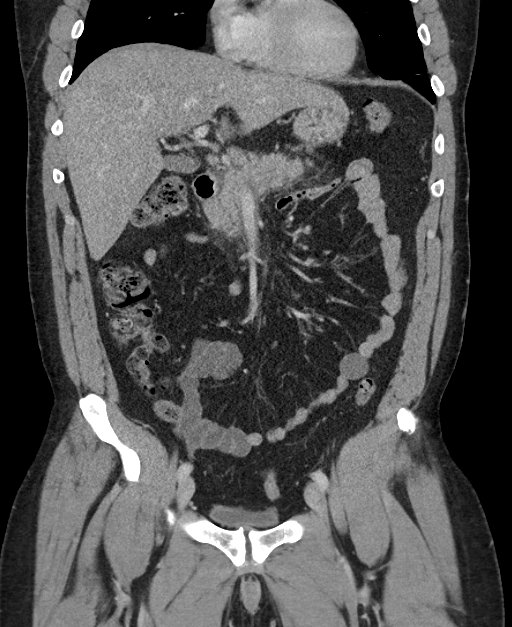
[im 56/101  soft-tissue]
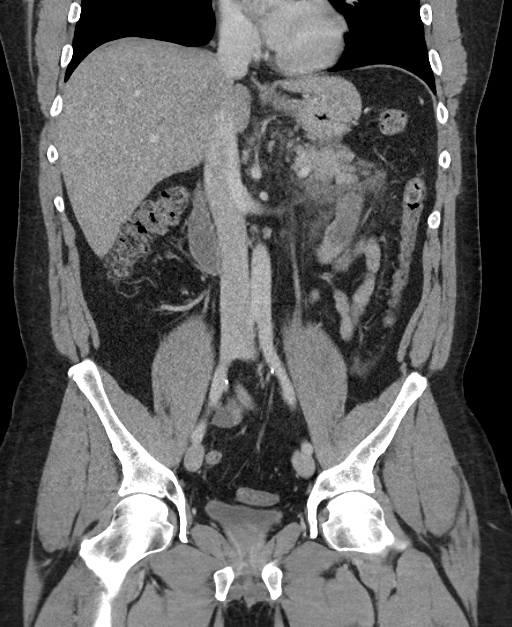

[16 of 46 positions shown; findings below may reference images not displayed]

FINDINGS: Lower chest: Lung bases demonstrate no acute consolidation or
effusion. The heart size is normal.

Hepatobiliary: No focal liver abnormality is seen. No gallstones,
gallbladder wall thickening, or biliary dilatation.

Pancreas: Moderate to marked edema and fluid surrounding the
pancreas consistent with acute pancreatitis. No hypoenhancement to
suggest necrosis. No organized fluid collection.

Spleen: Normal in size without focal abnormality.

Adrenals/Urinary Tract: Adrenal glands are unremarkable. Kidneys are
normal, without renal calculi, focal lesion, or hydronephrosis.
Bladder is unremarkable.

Stomach/Bowel: Stomach is within normal limits. Appendix appears
normal. No evidence of bowel wall thickening, distention, or
inflammatory changes.

Vascular/Lymphatic: Normal enhancement of portal and splenic veins.
Nonaneurysmal aorta. No significantly enlarged lymph nodes.

Reproductive: Prostate is unremarkable.

Other: No free air

Musculoskeletal: No acute or significant osseous findings.
Degenerative changes at L5-S1.
IMPRESSION: Findings consistent with acute pancreatitis. No evidence for
organized pancreatic fluid collection or pancreatic necrosis at this
time.

## 2019-06-28 DIAGNOSIS — E669 Obesity, unspecified: Secondary | ICD-10-CM | POA: Insufficient documentation

## 2019-06-28 DIAGNOSIS — Z6834 Body mass index (BMI) 34.0-34.9, adult: Secondary | ICD-10-CM | POA: Insufficient documentation

## 2019-10-15 ENCOUNTER — Other Ambulatory Visit: Payer: Self-pay

## 2019-10-15 ENCOUNTER — Emergency Department (HOSPITAL_BASED_OUTPATIENT_CLINIC_OR_DEPARTMENT_OTHER)
Admission: EM | Admit: 2019-10-15 | Discharge: 2019-10-15 | Disposition: A | Discharge status: Home / Self Care | Payer: BLUE CROSS/BLUE SHIELD | Attending: Emergency Medicine | Admitting: Emergency Medicine

## 2019-10-15 ENCOUNTER — Encounter (HOSPITAL_BASED_OUTPATIENT_CLINIC_OR_DEPARTMENT_OTHER): Payer: Self-pay

## 2019-10-15 DIAGNOSIS — R5383 Other fatigue: Secondary | ICD-10-CM | POA: Insufficient documentation

## 2019-10-15 DIAGNOSIS — Z7982 Long term (current) use of aspirin: Secondary | ICD-10-CM | POA: Insufficient documentation

## 2019-10-15 DIAGNOSIS — K921 Melena: Secondary | ICD-10-CM | POA: Insufficient documentation

## 2019-10-15 DIAGNOSIS — R519 Headache, unspecified: Secondary | ICD-10-CM | POA: Insufficient documentation

## 2019-10-15 DIAGNOSIS — R002 Palpitations: Secondary | ICD-10-CM | POA: Insufficient documentation

## 2019-10-15 DIAGNOSIS — I10 Essential (primary) hypertension: Secondary | ICD-10-CM | POA: Insufficient documentation

## 2019-10-15 DIAGNOSIS — R200 Anesthesia of skin: Secondary | ICD-10-CM | POA: Insufficient documentation

## 2019-10-15 DIAGNOSIS — R195 Other fecal abnormalities: Secondary | ICD-10-CM

## 2019-10-15 DIAGNOSIS — E876 Hypokalemia: Secondary | ICD-10-CM | POA: Insufficient documentation

## 2019-10-15 DIAGNOSIS — Z79899 Other long term (current) drug therapy: Secondary | ICD-10-CM | POA: Insufficient documentation

## 2019-10-15 LAB — COMPREHENSIVE METABOLIC PANEL
ALT: 59 U/L — ABNORMAL HIGH (ref 0–44)
AST: 70 U/L — ABNORMAL HIGH (ref 15–41)
Albumin: 4.3 g/dL (ref 3.5–5.0)
Alkaline Phosphatase: 35 U/L — ABNORMAL LOW (ref 38–126)
Anion gap: 11 (ref 5–15)
BUN: 18 mg/dL (ref 6–20)
CO2: 26 mmol/L (ref 22–32)
Calcium: 9.2 mg/dL (ref 8.9–10.3)
Chloride: 98 mmol/L (ref 98–111)
Creatinine, Ser: 1.26 mg/dL — ABNORMAL HIGH (ref 0.61–1.24)
GFR calc Af Amer: >60 mL/min (ref >60)
GFR calc non Af Amer: >60 mL/min (ref >60)
Glucose, Bld: 111 mg/dL — ABNORMAL HIGH (ref 70–99)
Potassium: 3.1 mmol/L — ABNORMAL LOW (ref 3.5–5.1)
Sodium: 135 mmol/L (ref 135–145)
Total Bilirubin: 0.6 mg/dL (ref 0.3–1.2)
Total Protein: 7.5 g/dL (ref 6.5–8.1)

## 2019-10-15 LAB — CBC
HCT: 34.7 % — ABNORMAL LOW (ref 39.0–52.0)
Hemoglobin: 11.6 g/dL — ABNORMAL LOW (ref 13.0–17.0)
MCH: 33.5 pg (ref 26.0–34.0)
MCHC: 33.4 g/dL (ref 30.0–36.0)
MCV: 100.3 fL — ABNORMAL HIGH (ref 80.0–100.0)
Platelets: 229 10*3/uL (ref 150–400)
RBC: 3.46 MIL/uL — ABNORMAL LOW (ref 4.22–5.81)
RDW: 14.3 % (ref 11.5–15.5)
WBC: 9.7 10*3/uL (ref 4.0–10.5)
nRBC: 0 % (ref 0.0–0.2)

## 2019-10-15 LAB — MAGNESIUM: Magnesium: 2.1 mg/dL (ref 1.7–2.4)

## 2019-10-15 LAB — HEMOGLOBIN AND HEMATOCRIT, BLOOD
HCT: 35.3 % — ABNORMAL LOW (ref 39.0–52.0)
Hemoglobin: 11.9 g/dL — ABNORMAL LOW (ref 13.0–17.0)

## 2019-10-15 LAB — PROTIME-INR
INR: 1 (ref 0.8–1.2)
Prothrombin Time: 12.7 seconds (ref 11.4–15.2)

## 2019-10-15 LAB — OCCULT BLOOD X 1 CARD TO LAB, STOOL: Fecal Occult Bld: NEGATIVE

## 2019-10-15 MED ORDER — POTASSIUM CHLORIDE CRYS ER 20 MEQ PO TBCR
20.0000 meq | EXTENDED_RELEASE_TABLET | Freq: Every day | ORAL | 0 refills | Status: DC
Start: 2019-10-15 — End: 2020-02-12

## 2019-10-15 MED ORDER — LACTATED RINGERS IV BOLUS
1000.0000 mL | Freq: Once | INTRAVENOUS | Status: AC
  Administered 2019-10-15: 1000 mL via INTRAVENOUS

## 2019-10-15 MED ORDER — POTASSIUM CHLORIDE CRYS ER 20 MEQ PO TBCR
40.0000 meq | EXTENDED_RELEASE_TABLET | Freq: Once | ORAL | Status: AC
  Administered 2019-10-15: 40 meq via ORAL
  Filled 2019-10-15: qty 2

## 2019-10-15 MED FILL — POTASSIUM CHLORIDE CRYS ER: 20 | 3 days supply | Qty: 3 | Fill #0

## 2019-10-15 NOTE — ED Triage Notes (Signed)
Pt c/o Robin stools, feet numbness, HA, heart racing-sx started 5 days ago-NAD-steady gait

## 2019-10-15 NOTE — ED Provider Notes (Signed)
[facility] EMERGENCY DEPARTMENT Provider Note   CSN: [id number] Arrival date & time: 10/15/19  1048     History Chief Complaint  Patient presents with  . Melena    Tony Walker is a 49 y.o. male.  HPI 49 year old male presents with multiple complaints.  States he started feeling bad on 7/26.  He notes that he originally started having [place] stools.  This occurs about twice a day and is loose.  Starting to lighten up over the week.  He is also been feeling very fatigued when he first wakes up despite feeling like he gets adequate sleep.  He has been noticing a morning headache until he gets up and starts doing things the last couple days.  No vomiting or abdominal pain.  He has noticed some tingling to the bottom of his feet bilaterally.  He has a history of alcohol abuse but states he does not drink daily.  He feels palpitations when he lies flat at night though no chest pain.   Past Medical History:  Diagnosis Date  . Hyperlipidemia 12/18/2011  . Hypertension 12/18/2011  . Stroke [facility]) 02-2014    Patient Active Problem List   Diagnosis Date Noted  . Viral URI with cough 02/22/2015  . PCP NOTES >>>>> 02/07/2015  . Renal insufficiency 02/21/2014  . Abdominal pain, right upper quadrant 02/21/2014  . Transaminitis 02/20/2014  . Acute CVA (cerebrovascular accident) (HCC) 02/20/2014  . ETOH abuse 02/20/2014  . AKI (acute kidney injury) (HCC) 02/18/2014  . Other malaise and fatigue 10/27/2013  . Gingivitis 10/14/2013  . Hypertension 12/18/2011  . Hyperlipidemia 12/18/2011  . Abnormal LFTs 10/03/2011    Past Surgical History:  Procedure Laterality Date  . ACHILLES TENDON REPAIR    . NO PAST SURGERIES         Family History  Problem Relation Age of Onset  . Hypertension Mother   . Alcoholism Father     Social History   Tobacco Use  . Smoking status: Never Smoker  . Smokeless tobacco: Current User    Types: Chew  . Tobacco comment: d/c cigars 2013    Vaping Use  . Vaping Use: Never used  Substance Use Topics  . Alcohol use: Yes    Comment: weekly  . Drug use: No    Home Medications Prior to Admission medications   Medication Sig Start Date End Date Taking? Authorizing Provider  aspirin EC 81 MG tablet Take 1 tablet (81 mg total) by mouth daily. 02/20/14  Yes Dhungel, Nishant, MD  carvedilol (COREG) 25 MG tablet Take 1 tablet (25 mg total) by mouth 2 (two) times daily with a meal. 10/22/17 10/15/19 Yes Yu, Amy V, PA-C  hydrALAZINE (APRESOLINE) 25 MG tablet Take 1 tablet (25 mg total) by mouth every 8 (eight) hours. Patient taking differently: Take 25 mg by mouth in the morning and at bedtime.  08/27/16  Yes Oxford, Anselm Pancoast, FNP  potassium chloride SA (KLOR-CON) 20 MEQ tablet Take 1 tablet (20 mEq total) by mouth daily. 10/15/19   Pricilla Loveless, MD    Allergies    Patient has no known allergies.  Review of Systems   Review of Systems  Constitutional: Positive for fatigue. Negative for fever.  Respiratory: Negative for shortness of breath.   Cardiovascular: Positive for palpitations. Negative for chest pain.  Gastrointestinal: Negative for vomiting.  Neurological: Positive for numbness and headaches. Negative for weakness.  All other systems reviewed and are negative.   Physical Exam Updated  Vital Signs BP (!) 139/99 (BP Location: Left Arm)   Pulse 76   Temp 98.3 F (36.8 C) (Oral)   Resp 16   Ht 6\' 3"  (1.905 m)   Wt (!) 116.1 kg   SpO2 100%   BMI 32.00 kg/m   Physical Exam Vitals and nursing note reviewed. Exam conducted with a chaperone present.  Constitutional:      General: He is not in acute distress.    Appearance: He is well-developed. He is not ill-appearing or diaphoretic.  HENT:     Head: Normocephalic and atraumatic.     Right Ear: External ear normal.     Left Ear: External ear normal.     Nose: Nose normal.  Eyes:     General:        Right eye: No discharge.        Left eye: No discharge.   Cardiovascular:     Rate and Rhythm: Normal rate and regular rhythm.     Heart sounds: Normal heart sounds.  Pulmonary:     Effort: Pulmonary effort is normal.     Breath sounds: Normal breath sounds.  Abdominal:     Palpations: Abdomen is soft.     Tenderness: There is no abdominal tenderness.  Genitourinary:    Rectum: No mass, tenderness, anal fissure, external hemorrhoid or internal hemorrhoid. Normal anal tone.     Comments: Essentially no stool on DRE Musculoskeletal:     Cervical back: Neck supple.  Skin:    General: Skin is warm and dry.  Neurological:     Mental Status: He is alert.     Comments: CN 3-12 grossly intact. 5/5 strength in all 4 extremities. Grossly normal sensation, including bottom of both feet. Normal finger to nose.  Psychiatric:        Mood and Affect: Mood is not anxious.     ED Results / Procedures / Treatments   Labs (all labs ordered are listed, but only abnormal results are displayed) Labs Reviewed  COMPREHENSIVE METABOLIC PANEL - Abnormal; Notable for the following components:      Result Value   Potassium 3.1 (*)    Glucose, Bld 111 (*)    Creatinine, Ser 1.26 (*)    AST 70 (*)    ALT 59 (*)    Alkaline Phosphatase 35 (*)    All other components within normal limits  CBC - Abnormal; Notable for the following components:   RBC 3.46 (*)    Hemoglobin 11.6 (*)    HCT 34.7 (*)    MCV 100.3 (*)    All other components within normal limits  HEMOGLOBIN AND HEMATOCRIT, BLOOD - Abnormal; Notable for the following components:   Hemoglobin 11.9 (*)    HCT 35.3 (*)    All other components within normal limits  PROTIME-INR  MAGNESIUM  OCCULT BLOOD X 1 CARD TO LAB, STOOL  POC OCCULT BLOOD, ED  TYPE AND SCREEN    EKG EKG Interpretation  Date/Time:  Friday October 15 2019 11:33:47 EDT Ventricular Rate:  84 PR Interval:  152 QRS Duration: 96 QT Interval:  370 QTC Calculation: 437 R Axis:   61 Text Interpretation: Normal sinus rhythm no  acute ST/T changes similar to Nov 2013 Confirmed by Dec 2013 [phone]) on 10/15/2019 3:29:48 PM   Radiology No results found.  Procedures Procedures (including critical care time)  Medications Ordered in ED Medications  potassium chloride SA (KLOR-CON) CR tablet 40 mEq (40 mEq Oral Given 10/15/19  1612)  lactated ringers bolus 1,000 mL (1,000 mLs Intravenous New Bag/Given 10/15/19 1607)    ED Course  I have reviewed the triage vital signs and the nursing notes.  Pertinent labs & imaging results that were available during my care of the patient were reviewed by me and considered in my medical decision making (see chart for details).    MDM Rules/Calculators/A&P                          Patient's hemoglobin was rechecked given his slightly low but it is stable several hours after the initial.  My suspicion of acute bleeding is low.  Does not really seem like melena and there was no stool on rectal exam.  Labs otherwise benign besides some mild chronic LFT elevation and the hypokalemia.  This could be contributing to the paresthesias in his plantar feet.  Given his bilateral I think stroke or other acute CNS emergency is pretty low.  Given his morning headache I did discuss head CT but patient declines.  Given he is not having an intractable headache or vomiting I think this is reasonable but stressed he may need outpatient imaging if headache continues and he should follow-up with PCP.  Otherwise, feels a little better with fluids and appears stable for discharge home. Final Clinical Impression(s) / ED Diagnoses Final diagnoses:  Piehl stools  Hypokalemia    Rx / DC Orders ED Discharge Orders         Ordered    potassium chloride SA (KLOR-CON) 20 MEQ tablet  Daily     Discontinue  Reprint     10/15/19 1702           Pricilla Loveless, MD 10/15/19 1705

## 2019-10-15 NOTE — ED Notes (Signed)
ED Provider at bedside. 

## 2019-10-15 NOTE — Discharge Instructions (Signed)
If you develop continued, recurrent, or worsening headache, fever, neck stiffness, vomiting, blurry or double vision, weakness or numbness in your arms or legs, trouble speaking, or any other new/concerning symptoms then return to the ER for evaluation.  

## 2020-02-11 ENCOUNTER — Other Ambulatory Visit: Payer: Self-pay

## 2020-02-11 ENCOUNTER — Encounter (HOSPITAL_BASED_OUTPATIENT_CLINIC_OR_DEPARTMENT_OTHER): Payer: Self-pay | Admitting: Emergency Medicine

## 2020-02-11 DIAGNOSIS — F17220 Nicotine dependence, chewing tobacco, uncomplicated: Secondary | ICD-10-CM | POA: Insufficient documentation

## 2020-02-11 DIAGNOSIS — Z7982 Long term (current) use of aspirin: Secondary | ICD-10-CM | POA: Insufficient documentation

## 2020-02-11 DIAGNOSIS — R202 Paresthesia of skin: Secondary | ICD-10-CM | POA: Insufficient documentation

## 2020-02-11 DIAGNOSIS — E876 Hypokalemia: Secondary | ICD-10-CM | POA: Insufficient documentation

## 2020-02-11 DIAGNOSIS — I10 Essential (primary) hypertension: Secondary | ICD-10-CM | POA: Insufficient documentation

## 2020-02-11 DIAGNOSIS — Z79899 Other long term (current) drug therapy: Secondary | ICD-10-CM | POA: Insufficient documentation

## 2020-02-11 NOTE — ED Triage Notes (Signed)
Pt reports tingling in feet and hands x1 week - has experienced this in the past with low potassium. Concerned for low potassium. States heart racing when laying down.

## 2020-02-12 ENCOUNTER — Emergency Department (HOSPITAL_BASED_OUTPATIENT_CLINIC_OR_DEPARTMENT_OTHER)
Admission: EM | Admit: 2020-02-12 | Discharge: 2020-02-12 | Disposition: A | Discharge status: Home / Self Care | Payer: Self-pay | Attending: Emergency Medicine | Admitting: Emergency Medicine

## 2020-02-12 DIAGNOSIS — E876 Hypokalemia: Secondary | ICD-10-CM

## 2020-02-12 DIAGNOSIS — I10 Essential (primary) hypertension: Secondary | ICD-10-CM

## 2020-02-12 DIAGNOSIS — R202 Paresthesia of skin: Secondary | ICD-10-CM

## 2020-02-12 LAB — CBC WITH DIFFERENTIAL/PLATELET
Abs Immature Granulocytes: 0.03 10*3/uL (ref 0.00–0.07)
Basophils Absolute: 0.1 10*3/uL (ref 0.0–0.1)
Basophils Relative: 2 %
Eosinophils Absolute: 0.1 10*3/uL (ref 0.0–0.5)
Eosinophils Relative: 1 %
HCT: 33.9 % — ABNORMAL LOW (ref 39.0–52.0)
Hemoglobin: 12 g/dL — ABNORMAL LOW (ref 13.0–17.0)
Immature Granulocytes: 1 %
Lymphocytes Relative: 36 %
Lymphs Abs: 2.2 10*3/uL (ref 0.7–4.0)
MCH: 37.3 pg — ABNORMAL HIGH (ref 26.0–34.0)
MCHC: 35.4 g/dL (ref 30.0–36.0)
MCV: 105.3 fL — ABNORMAL HIGH (ref 80.0–100.0)
Monocytes Absolute: 0.7 10*3/uL (ref 0.1–1.0)
Monocytes Relative: 11 %
Neutro Abs: 3 10*3/uL (ref 1.7–7.7)
Neutrophils Relative %: 49 %
Platelets: 154 10*3/uL (ref 150–400)
RBC: 3.22 MIL/uL — ABNORMAL LOW (ref 4.22–5.81)
RDW: 17.9 % — ABNORMAL HIGH (ref 11.5–15.5)
WBC: 6.1 10*3/uL (ref 4.0–10.5)
nRBC: 0.7 % — ABNORMAL HIGH (ref 0.0–0.2)

## 2020-02-12 LAB — BASIC METABOLIC PANEL
Anion gap: 12 (ref 5–15)
BUN: 9 mg/dL (ref 6–20)
CO2: 27 mmol/L (ref 22–32)
Calcium: 8 mg/dL — ABNORMAL LOW (ref 8.9–10.3)
Chloride: 94 mmol/L — ABNORMAL LOW (ref 98–111)
Creatinine, Ser: 0.98 mg/dL (ref 0.61–1.24)
GFR, Estimated: >60 mL/min (ref >60)
Glucose, Bld: 116 mg/dL — ABNORMAL HIGH (ref 70–99)
Potassium: 2.6 mmol/L — CL (ref 3.5–5.1)
Sodium: 133 mmol/L — ABNORMAL LOW (ref 135–145)

## 2020-02-12 LAB — MAGNESIUM: Magnesium: 1 mg/dL — ABNORMAL LOW (ref 1.7–2.4)

## 2020-02-12 MED ORDER — MAGNESIUM CHLORIDE 64 MG PO TBEC: 1.0000 | DELAYED_RELEASE_TABLET | Freq: Two times a day (BID) | ORAL | 0 refills | Status: DC

## 2020-02-12 MED ORDER — MAGNESIUM SULFATE 2 GM/50ML IV SOLN
2.0000 g | Freq: Once | INTRAVENOUS | Status: AC
  Administered 2020-02-12: 2 g via INTRAVENOUS
  Filled 2020-02-12: qty 50

## 2020-02-12 MED ORDER — POTASSIUM CHLORIDE CRYS ER 20 MEQ PO TBCR
20.0000 meq | EXTENDED_RELEASE_TABLET | Freq: Two times a day (BID) | ORAL | 0 refills | Status: DC
Start: 2020-02-12 — End: 2020-02-12

## 2020-02-12 MED ORDER — POTASSIUM CHLORIDE CRYS ER 20 MEQ PO TBCR
40.0000 meq | EXTENDED_RELEASE_TABLET | Freq: Once | ORAL | Status: AC
  Administered 2020-02-12: 40 meq via ORAL
  Filled 2020-02-12: qty 2

## 2020-02-12 MED ORDER — POTASSIUM CHLORIDE 10 MEQ/100ML IV SOLN
10.0000 meq | INTRAVENOUS | Status: DC
  Administered 2020-02-12 (×2): 10 meq via INTRAVENOUS
  Filled 2020-02-12 (×3): qty 100

## 2020-02-12 MED ORDER — POTASSIUM CHLORIDE CRYS ER 20 MEQ PO TBCR
20.0000 meq | EXTENDED_RELEASE_TABLET | Freq: Two times a day (BID) | ORAL | 0 refills | Status: DC
Start: 2020-02-12 — End: 2020-05-18

## 2020-02-12 NOTE — ED Notes (Signed)
Date and time results received: 02/12/20 0524 (use smartphrase ".now" to insert current time)  Test: K+ Critical Value: 2.6  Name of Provider Notified: Preston Fleeting  Orders Received? Or Actions Taken?: Actions Taken: give K+

## 2020-02-12 NOTE — ED Provider Notes (Signed)
Pt is feeling much better after K and Mg infusions.  He is not on any meds which would lower his K and Mg, so it may be from inadequate dietary intake.  Pt given a list of foods high in K and Mg.  He just refilled his bp meds yesterday, so will take them when he gets home.  Pt is stable for d/c.  Return if worse.   Jacalyn Lefevre, MD 02/12/20 [phone]

## 2020-02-12 NOTE — Discharge Instructions (Addendum)
Please check your blood pressure once a day and keep a record of it. Take that record with you when you see a physician.

## 2020-02-12 NOTE — ED Provider Notes (Signed)
[facility] EMERGENCY DEPARTMENT Provider Note   CSN: [id number] Arrival date & time: 02/11/20  2311   History Chief Complaint  Patient presents with   Tingling    bilateral hands and feet x1 week   Palpitations    when laying down    Tony Walker is a 49 y.o. male.  The history is provided by the patient.  Palpitations He has history of hypertension, stroke, and comes in because of tingling in his feet for the last several days. It does not bother him much during the day, but bothers him when he lays down at night. Also, he has run out of his blood pressure medicine about 3 weeks ago. Last time he checked his blood pressure was 2 weeks ago and was elevated.Marland Kitchen He denies any weakness. Last time he had similar symptoms, his potassium was low. He does state that he has been trying to eat bananas and take over-the-counter potassium supplements.  Past Medical History:  Diagnosis Date   Hyperlipidemia 12/18/2011   Hypertension 12/18/2011   Stroke (HCC) 02-2014    Patient Active Problem List   Diagnosis Date Noted   Viral URI with cough 02/22/2015   PCP NOTES >>>>> 02/07/2015   Renal insufficiency 02/21/2014   Abdominal pain, right upper quadrant 02/21/2014   Transaminitis 02/20/2014   Acute CVA (cerebrovascular accident) (HCC) 02/20/2014   ETOH abuse 02/20/2014   AKI (acute kidney injury) (HCC) 02/18/2014   Other malaise and fatigue 10/27/2013   Gingivitis 10/14/2013   Hypertension 12/18/2011   Hyperlipidemia 12/18/2011   Abnormal LFTs 10/03/2011    Past Surgical History:  Procedure Laterality Date   ACHILLES TENDON REPAIR     NO PAST SURGERIES         Family History  Problem Relation Age of Onset   Hypertension Mother    Alcoholism Father     Social History   Tobacco Use   Smoking status: Never Smoker   Smokeless tobacco: Current User    Types: Chew   Tobacco comment: d/c cigars 2013  Vaping Use   Vaping Use: Never used    Substance Use Topics   Alcohol use: Yes    Comment: weekly   Drug use: No    Home Medications Prior to Admission medications   Medication Sig Start Date End Date Taking? Authorizing Provider  aspirin EC 81 MG tablet Take 1 tablet (81 mg total) by mouth daily. 02/20/14   Dhungel, Theda Belfast, MD  carvedilol (COREG) 25 MG tablet Take 1 tablet (25 mg total) by mouth 2 (two) times daily with a meal. 10/22/17 10/15/19  Yu, Amy V, PA-C  hydrALAZINE (APRESOLINE) 25 MG tablet Take 1 tablet (25 mg total) by mouth every 8 (eight) hours. Patient taking differently: Take 25 mg by mouth in the morning and at bedtime.  08/27/16   Deatra Canter, FNP  potassium chloride SA (KLOR-CON) 20 MEQ tablet Take 1 tablet (20 mEq total) by mouth daily. 10/15/19   Pricilla Loveless, MD    Allergies    Patient has no known allergies.  Review of Systems   Review of Systems  Cardiovascular: Positive for palpitations.  All other systems reviewed and are negative.   Physical Exam Updated Vital Signs BP (!) 159/115 (BP Location: Left Arm)    Pulse 90    Temp 98.8 F (37.1 C) (Oral)    Resp 16    Wt 117 kg    SpO2 100%    BMI 32.25 kg/m  Physical Exam Vitals and nursing note reviewed.   49 year old male, resting comfortably and in no acute distress. Vital signs are significant for elevated blood pressure. Oxygen saturation is 100%, which is normal. Head is normocephalic and atraumatic. PERRLA, EOMI. Oropharynx is clear. Neck is nontender and supple without adenopathy or JVD. Back is nontender and there is no CVA tenderness. Lungs are clear without rales, wheezes, or rhonchi. Chest is nontender. Heart has regular rate and rhythm without murmur. Abdomen is soft, flat, nontender without masses or hepatosplenomegaly and peristalsis is normoactive. Extremities have no cyanosis or edema, full range of motion is present. Skin is warm and dry without rash. Neurologic: Mental status is normal, cranial nerves are  intact, there are no motor or sensory deficits.   ED Results / Procedures / Treatments   Labs (all labs ordered are listed, but only abnormal results are displayed) Labs Reviewed - No data to display  EKG EKG Interpretation  Date/Time:  Friday February 11 2020 23:16:37 EST Ventricular Rate:  99 PR Interval:  138 QRS Duration: 86 QT Interval:  400 QTC Calculation: 513 R Axis:   72 Text Interpretation: Normal sinus rhythm Prolonged QT Abnormal ECG When compared with ECG of 10/15/2019, No significant change was found Confirmed by Dione Booze ([id number]) on 02/11/2020 11:36:12 PM  Procedures Procedures   Medications Ordered in ED Medications - No data to display  ED Course  I have reviewed the triage vital signs and the nursing notes.  Pertinent lab results that were available during my care of the patient were reviewed by me and considered in my medical decision making (see chart for details).  MDM Rules/Calculators/A&P Paresthesias in the feet without objective neurologic findings. Elevated blood pressure most likely secondary to medication noncompliance. Of note, he states that he went back on his blood pressure medicine 2 days ago, will need to have his blood pressure checked approximately 1-2 weeks after restarting medications to see if he is getting adequate control. Old records are reviewed confirming ED visit last July with complaints of paresthesia and potassium 3.0 on that occasion. Curiously, he is not on any diuretics. Will check electrolytes. ECG shows prolonged QTC. We will also check magnesium level.  Potassium and magnesium are both very low.Will give both intravenously. Will plan on referral to nephrology to find out why he is having recurrent hypokalemia in the absence of diuretic use. He is discharged with prescriptions for potassium and magnesium.  Final Clinical Impression(s) / ED Diagnoses Final diagnoses:  Hypokalemia  Hypomagnesemia  Elevated blood pressure  reading with diagnosis of hypertension  Paresthesias    Rx / DC Orders ED Discharge Orders         Ordered    magnesium chloride (SLOW-MAG) 64 MG TBEC SR tablet  2 times daily        02/12/20 0640    potassium chloride SA (KLOR-CON) 20 MEQ tablet  2 times daily        02/12/20 0640           Dione Booze, MD 02/12/20 [phone]

## 2020-02-13 ENCOUNTER — Encounter (HOSPITAL_BASED_OUTPATIENT_CLINIC_OR_DEPARTMENT_OTHER): Payer: Self-pay | Admitting: Emergency Medicine

## 2020-02-13 ENCOUNTER — Other Ambulatory Visit: Payer: Self-pay

## 2020-02-13 ENCOUNTER — Emergency Department (HOSPITAL_BASED_OUTPATIENT_CLINIC_OR_DEPARTMENT_OTHER)
Admission: EM | Admit: 2020-02-13 | Discharge: 2020-02-13 | Disposition: A | Discharge status: Home / Self Care | Payer: Self-pay | Attending: Emergency Medicine | Admitting: Emergency Medicine

## 2020-02-13 DIAGNOSIS — F17220 Nicotine dependence, chewing tobacco, uncomplicated: Secondary | ICD-10-CM | POA: Insufficient documentation

## 2020-02-13 DIAGNOSIS — Z7982 Long term (current) use of aspirin: Secondary | ICD-10-CM | POA: Insufficient documentation

## 2020-02-13 DIAGNOSIS — I10 Essential (primary) hypertension: Secondary | ICD-10-CM | POA: Insufficient documentation

## 2020-02-13 DIAGNOSIS — Z79899 Other long term (current) drug therapy: Secondary | ICD-10-CM | POA: Insufficient documentation

## 2020-02-13 NOTE — ED Notes (Signed)
ED Provider at bedside. 

## 2020-02-13 NOTE — ED Provider Notes (Signed)
[facility] EMERGENCY DEPARTMENT Provider Note   CSN: [id number] Arrival date & time: 02/13/20  1032     History Chief Complaint  Patient presents with   Hypertension    Tony Walker is a 49 y.o. male past medical story of hyperlipidemia, hypertension, stroke who presents for evaluation of concern for high blood pressure.  Patient reports that he was recently off his blood pressure medications for about 3 weeks.  He started them about 3 days ago.  He has had 5 doses since then.  He states he was seen here in the ED on 02/11/20 for some tingling to his toes that he had.  He was found to have hypokalemia, hypomagnesia.  He reports that when he got discharged, his blood pressures was in the 150s.  This morning when he got up, he checked it and it was in the 170s.  He went to [facility] and had it rechecked and it was 180/132.  Patient states he was concerned and came to the ED.  He states he has taken his blood pressure medication this morning.  He denies any chest pain, difficulty breathing, abdominal pain, nausea/vomiting, numbness/weakness of his arms.  He states he does not have any weakness in his legs.  He still has occasional tingling in his toes but he states is not gotten worse.  He states he has taken his medication this morning.   The history is provided by the patient.       Past Medical History:  Diagnosis Date   Hyperlipidemia 12/18/2011   Hypertension 12/18/2011   Stroke (HCC) 02-2014    Patient Active Problem List   Diagnosis Date Noted   Viral URI with cough 02/22/2015   PCP NOTES >>>>> 02/07/2015   Renal insufficiency 02/21/2014   Abdominal pain, right upper quadrant 02/21/2014   Transaminitis 02/20/2014   Acute CVA (cerebrovascular accident) (HCC) 02/20/2014   ETOH abuse 02/20/2014   AKI (acute kidney injury) (HCC) 02/18/2014   Other malaise and fatigue 10/27/2013   Gingivitis 10/14/2013   Hypertension 12/18/2011   Hyperlipidemia  12/18/2011   Abnormal LFTs 10/03/2011    Past Surgical History:  Procedure Laterality Date   ACHILLES TENDON REPAIR     NO PAST SURGERIES         Family History  Problem Relation Age of Onset   Hypertension Mother    Alcoholism Father     Social History   Tobacco Use   Smoking status: Never Smoker   Smokeless tobacco: Current User    Types: Chew   Tobacco comment: d/c cigars 2013  Vaping Use   Vaping Use: Never used  Substance Use Topics   Alcohol use: Yes    Comment: weekly   Drug use: No    Home Medications Prior to Admission medications   Medication Sig Start Date End Date Taking? Authorizing Provider  aspirin EC 81 MG tablet Take 1 tablet (81 mg total) by mouth daily. 02/20/14   Dhungel, Theda Belfast, MD  carvedilol (COREG) 25 MG tablet Take 1 tablet (25 mg total) by mouth 2 (two) times daily with a meal. 10/22/17 10/15/19  Yu, Amy V, PA-C  hydrALAZINE (APRESOLINE) 25 MG tablet Take 1 tablet (25 mg total) by mouth every 8 (eight) hours. Patient taking differently: Take 25 mg by mouth in the morning and at bedtime.  08/27/16   Deatra Canter, FNP  magnesium chloride (SLOW-MAG) 64 MG TBEC SR tablet Take 1 tablet (64 mg total) by mouth 2 (two)  times daily. 02/12/20   Jacalyn Lefevre, MD  potassium chloride SA (KLOR-CON) 20 MEQ tablet Take 1 tablet (20 mEq total) by mouth 2 (two) times daily. 02/12/20   Jacalyn Lefevre, MD    Allergies    Patient has no known allergies.  Review of Systems   Review of Systems  Constitutional: Negative for fever.  Eyes: Negative for visual disturbance.  Respiratory: Negative for cough and shortness of breath.   Cardiovascular: Negative for chest pain.  Gastrointestinal: Negative for abdominal pain, nausea and vomiting.  Neurological: Negative for weakness and headaches.  All other systems reviewed and are negative.   Physical Exam Updated Vital Signs BP (!) 167/102 (BP Location: Right Arm)    Pulse 70    Temp 98.9 F  (37.2 C) (Oral)    Resp 16    SpO2 97%   Physical Exam Vitals and nursing note reviewed.  Constitutional:      Appearance: Normal appearance. He is well-developed.  HENT:     Head: Normocephalic and atraumatic.  Eyes:     General: Lids are normal.     Conjunctiva/sclera: Conjunctivae normal.     Pupils: Pupils are equal, round, and reactive to light.     Comments: PERRL. EOMs intact. No nystagmus. No neglect.   Cardiovascular:     Rate and Rhythm: Normal rate and regular rhythm.     Pulses: Normal pulses.          Radial pulses are 2+ on the right side and 2+ on the left side.       Dorsalis pedis pulses are 2+ on the right side and 2+ on the left side.     Heart sounds: Normal heart sounds. No murmur heard.  No friction rub. No gallop.   Pulmonary:     Effort: Pulmonary effort is normal.     Breath sounds: Normal breath sounds.     Comments: Lungs clear to auscultation bilaterally.  Symmetric chest rise.  No wheezing, rales, rhonchi.  Abdominal:     Palpations: Abdomen is soft. Abdomen is not rigid.     Tenderness: There is no abdominal tenderness. There is no guarding.     Comments: Abdomen is soft, non-distended, non-tender. No rigidity, No guarding. No peritoneal signs.  Musculoskeletal:        General: Normal range of motion.     Cervical back: Full passive range of motion without pain.  Skin:    General: Skin is warm and dry.     Capillary Refill: Capillary refill takes less than 2 seconds.  Neurological:     Mental Status: He is alert and oriented to person, place, and time.     Comments: Cranial nerves III-XII intact Follows commands, Moves all extremities  5/5 strength to BUE and BLE  Sensation intact throughout all major nerve distributions No pronator drift. No gait abnormalities  No slurred speech. No facial droop.   Psychiatric:        Speech: Speech normal.     ED Results / Procedures / Treatments   Labs (all labs ordered are listed, but only abnormal  results are displayed) Labs Reviewed - No data to display  EKG None  Radiology No results found.  Procedures Procedures (including critical care time)  Medications Ordered in ED Medications - No data to display  ED Course  I have reviewed the triage vital signs and the nursing notes.  Pertinent labs & imaging results that were available during my care of the patient  were reviewed by me and considered in my medical decision making (see chart for details).    MDM Rules/Calculators/A&P                          49 year old male who presents for evaluation of concern for high blood pressure.  History of hypertension is post to be on hydralazine, carvedilol.  States he checked his blood pressure this morning and it was elevated.  He went to [facility] to have it checked again and found to be elevated, prompting ED visit.  He denies any chest pain or difficulty breathing, vision changes, no numbness/weakness of his arms or legs.  He occasionally will have some tingling in his toes which he states has been ongoing.  Initially arrival, he is afebrile, nontoxic-appearing.  He is hypertensive with systolic blood pressure in the 190s.  Suspect this is most likely due to anxiety.  On exam, he has no neurological deficits.  He does not any complaints at this time.  I discussed with him that this is most likely asymptomatic hypertension.  We discussed that given his history of noncompliance over the last couple weeks, it may take a week or so for his blood pressure to normalize after being on his medications.  At this time, no \\indication  for intervention.  We will plan to observe him here in the ED and repeat his blood pressure.  Reevaluation.  Patient resting comfortably on bed.  Blood pressure has gone down to 161/104 without any acute intervention here in the ED.  Patient states he would like to go home.  We discussed the importance of compliance with his blood pressure medication as well as primary care  follow-up in a week or so to ensure that his blood pressure has been controlled with the medications.  We did discuss at length regarding high blood pressure readings at home.  I did offer him as needed amlodipine but he declined.  Patient will follow up with his primary care doctor. At this time, patient exhibits no emergent life-threatening condition that require further evaluation in ED.  Discussed patient with Dr. Rhunette Croft who is agreeable to plan. Patient had ample opportunity for questions and discussion. All patient's questions were answered with full understanding. Strict return precautions discussed. Patient expresses understanding and agreement to plan.   Portions of this note were generated with Scientist, clinical (histocompatibility and immunogenetics). Dictation errors may occur despite best attempts at proofreading.   Final Clinical Impression(s) / ED Diagnoses Final diagnoses:  Hypertension, unspecified type    Rx / DC Orders ED Discharge Orders    None       Maxwell Caul, PA-C 02/13/20 1244    Derwood Kaplan, MD 02/14/20 [phone]

## 2020-02-13 NOTE — ED Triage Notes (Addendum)
High blood pressure readings at home over the past 2 days since he was discharged. Recently started on a new BP medication. Denies chest pain, SOB

## 2020-02-13 NOTE — Discharge Instructions (Signed)
Make sure you are taking your blood pressure medication as directed.  Do not miss any doses.  Follow-up with your primary care doctor.  Arrange for an appointment in the next 1-2 weeks to have your blood pressure rechecked.  As we discussed, if it anytime, you feel your blood pressure is elevated you feel you are having chest pain, difficulty breathing, abdominal pain, numbness/weakness of your arms or legs, vision changes, return to emergency department for further evaluation.

## 2020-04-05 ENCOUNTER — Encounter: Payer: Self-pay | Admitting: Gastroenterology

## 2020-04-13 DIAGNOSIS — N62 Hypertrophy of breast: Secondary | ICD-10-CM | POA: Insufficient documentation

## 2020-05-18 ENCOUNTER — Ambulatory Visit (AMBULATORY_SURGERY_CENTER): Payer: Self-pay | Admitting: *Deleted

## 2020-05-18 ENCOUNTER — Other Ambulatory Visit: Payer: Self-pay

## 2020-05-18 VITALS — Ht 75.0 in | Wt 258.0 lb

## 2020-05-18 DIAGNOSIS — Z1211 Encounter for screening for malignant neoplasm of colon: Secondary | ICD-10-CM

## 2020-05-18 NOTE — Progress Notes (Signed)
Pt verified name, DOB, address and insurance during PV today. Pt mailed instruction packet to included paper to complete and mail back to [facility] with addressed and stamped envelope, Emmi video, copy of consent form to read and not return, and instructions.  PV completed over the phone. Pt encouraged to call with questions or issues   No egg or soy allergy known to patient  No issues with past sedation with any surgeries or procedures No intubation problems in the past  No FH of Malignant Hyperthermia No diet pills per patient No home 02 use per patient  No blood thinners per patient  Pt denies issues with constipation  No A fib or A flutter  EMMI video to pt or via MyChart  COVID 19 guidelines implemented in PV today with Pt and RN  Pt is fully vaccinated  for Covid   Due to the COVID-19 pandemic we are asking patients to follow certain guidelines.  Pt aware of COVID protocols and [facility] guidelines   

## 2020-05-22 ENCOUNTER — Encounter: Payer: Self-pay | Admitting: Gastroenterology

## 2020-06-02 ENCOUNTER — Encounter: Payer: Self-pay | Admitting: Gastroenterology

## 2020-06-02 ENCOUNTER — Ambulatory Visit (AMBULATORY_SURGERY_CENTER): Payer: BC Managed Care – PPO | Admitting: Gastroenterology

## 2020-06-02 ENCOUNTER — Other Ambulatory Visit: Payer: Self-pay

## 2020-06-02 VITALS — BP 102/75 | HR 56 | Temp 97.1°F | Resp 17 | Ht 75.0 in | Wt 258.0 lb

## 2020-06-02 DIAGNOSIS — Z1211 Encounter for screening for malignant neoplasm of colon: Secondary | ICD-10-CM

## 2020-06-02 MED ORDER — SODIUM CHLORIDE 0.9 % IV SOLN
500.0000 mL | Freq: Once | INTRAVENOUS | Status: AC
Start: 2020-06-02 — End: ?

## 2020-06-02 NOTE — Progress Notes (Signed)
pt tolerated well. VSS. awake and to recovery. Report given to RN.  

## 2020-06-02 NOTE — Patient Instructions (Signed)
High fiber diet, use Fibercon 1-2 tablets by mouth daily May use preparation H ointment 1-2 times daily as needed Continue present medications Repeat colonoscopy in 10 years for screening.  YOU HAD AN ENDOSCOPIC PROCEDURE TODAY AT THE [facility]:   Refer to the procedure report that was given to you for any specific questions about what was found during the examination.  If the procedure report does not answer your questions, please call your gastroenterologist to clarify.  If you requested that your care partner not be given the details of your procedure findings, then the procedure report has been included in a sealed envelope for you to review at your convenience later.  YOU SHOULD EXPECT: Some feelings of bloating in the abdomen. Passage of more gas than usual.  Walking can help get rid of the air that was put into your GI tract during the procedure and reduce the bloating. If you had a lower endoscopy (such as a colonoscopy or flexible sigmoidoscopy) you may notice spotting of blood in your stool or on the toilet paper. If you underwent a bowel prep for your procedure, you may not have a normal bowel movement for a few days.  Please Note:  You might notice some irritation and congestion in your nose or some drainage.  This is from the oxygen used during your procedure.  There is no need for concern and it should clear up in a day or so.  SYMPTOMS TO REPORT IMMEDIATELY:   Following lower endoscopy (colonoscopy or flexible sigmoidoscopy):  Excessive amounts of blood in the stool  Significant tenderness or worsening of abdominal pains  Swelling of the abdomen that is new, acute  Fever of 100F or higher   For urgent or emergent issues, a gastroenterologist can be reached at any hour by calling [phone]. Do not use MyChart messaging for urgent concerns.    DIET:  We do recommend a small meal at first, but then you may proceed to your regular diet.  Drink plenty of fluids  but you should avoid alcoholic beverages for 24 hours.  ACTIVITY:  You should plan to take it easy for the rest of today and you should NOT DRIVE or use heavy machinery until tomorrow (because of the sedation medicines used during the test).    FOLLOW UP: Our staff will call the number listed on your records 48-72 hours following your procedure to check on you and address any questions or concerns that you may have regarding the information given to you following your procedure. If we do not reach you, we will leave a message.  We will attempt to reach you two times.  During this call, we will ask if you have developed any symptoms of COVID 19. If you develop any symptoms (ie: fever, flu-like symptoms, shortness of breath, cough etc.) before then, please call [phone].  If you test positive for Covid 19 in the 2 weeks post procedure, please call and report this information to [place].    If any biopsies were taken you will be contacted by phone or by letter within the next 1-3 weeks.  Please call us at [phone] if you have not heard about the biopsies in 3 weeks.    SIGNATURES/CONFIDENTIALITY: You and/or your care partner have signed paperwork which will be entered into your electronic medical record.  These signatures attest to the fact that that the information above on your After Visit Summary has been reviewed and is understood.  Full responsibility  of the confidentiality of this discharge information lies with you and/or your care-partner.

## 2020-06-02 NOTE — Op Note (Signed)
[place] Patient Name: Tony Walker Procedure Date: 06/02/2020 8:45 AM MRN: [id number] Endoscopist: Justice Britain , MD Age: 50 Referring MD:  Date of Birth: November 09, 1970 Gender: Male Account #: [id number] Procedure:                Colonoscopy Indications:              Screening for colorectal malignant neoplasm Medicines:                Monitored Anesthesia Care Procedure:                Pre-Anesthesia Assessment:                           - Prior to the procedure, a History and Physical                            was performed, and patient medications and                            allergies were reviewed. The patient's tolerance of                            previous anesthesia was also reviewed. The risks                            and benefits of the procedure and the sedation                            options and risks were discussed with the patient.                            All questions were answered, and informed consent                            was obtained. Prior Anticoagulants: The patient has                            taken no previous anticoagulant or antiplatelet                            agents except for aspirin. ASA Grade Assessment:                            III - A patient with severe systemic disease. After                            reviewing the risks and benefits, the patient was                            deemed in satisfactory condition to undergo the                            procedure.  After obtaining informed consent, the colonoscope                            was passed under direct vision. Throughout the                            procedure, the patient's blood pressure, pulse, and                            oxygen saturations were monitored continuously. The                            Olympus CF-HQ190L [id number]) Colonoscope was                            introduced through the anus and advanced to the 5                             cm into the ileum. The colonoscopy was performed                            without difficulty. The patient tolerated the                            procedure. The quality of the bowel preparation was                            good. The terminal ileum, ileocecal valve,                            appendiceal orifice, and rectum were photographed. Scope In: 8:52:39 AM Scope Out: 9:05:16 AM Scope Withdrawal Time: 0 hours 9 minutes 17 seconds  Total Procedure Duration: 0 hours 12 minutes 37 seconds  Findings:                 The digital rectal exam findings include                            hemorrhoids. Pertinent negatives include no                            palpable rectal lesions.                           The terminal ileum and ileocecal valve appeared                            normal.                           Multiple small-mouthed diverticula were found in                            the recto-sigmoid colon and sigmoid colon.  Normal mucosa was found in the entire colon.                           Non-thrombosed external and internal hemorrhoids                            were found during retroflexion, during perianal                            exam and during digital exam. The hemorrhoids were                            Grade II (internal hemorrhoids that prolapse but                            reduce spontaneously). Small oozing from internal                            hemorrhoids noted however. Complications:            No immediate complications. Estimated Blood Loss:     Estimated blood loss was minimal. Estimated blood                            loss: none. Impression:               - Hemorrhoids found on digital rectal exam.                           - The examined portion of the ileum was normal.                           - Diverticulosis in the recto-sigmoid colon and in                            the sigmoid colon.                            - Normal mucosa in the entire examined colon.                           - Non-thrombosed external and internal hemorrhoids.                            Mild ooze noted from internal hemorrhoidal column. Recommendation:           - The patient will be observed post-procedure,                            until all discharge criteria are met.                           - Discharge patient to home.                           - Patient has a contact number available for  emergencies. The signs and symptoms of potential                            delayed complications were discussed with the                            patient. Return to normal activities tomorrow.                            Written discharge instructions were provided to the                            patient.                           - High fiber diet.                           - Use FiberCon 1-2 tablets PO daily.                           - May use Preparation H ointment/cream 1-2 times                            daily as needed                           - Continue present medications.                           - Repeat colonoscopy in 10 years for screening                            purposes.                           - The findings and recommendations were discussed                            with the patient.                           - The findings and recommendations were discussed                            with the patient's family. Justice Britain, MD 06/02/2020 9:11:13 AM

## 2020-06-02 NOTE — Progress Notes (Signed)
Pt's states no medical or surgical changes since previsit or office visit.  Check-in-AER  Vital signs-SB

## 2020-06-06 ENCOUNTER — Telehealth: Payer: Self-pay

## 2020-06-06 NOTE — Telephone Encounter (Signed)
  Follow up Call-  Call back number 06/02/2020  Post procedure Call Back phone  # [phone]  Permission to leave phone message Yes  Some recent data might be hidden     Patient questions:  Do you have a fever, pain , or abdominal swelling? No. Pain Score  0 *  Have you tolerated food without any problems? Yes.    Have you been able to return to your normal activities? Yes.    Do you have any questions about your discharge instructions: Diet   No. Medications  No. Follow up visit  No.  Do you have questions or concerns about your Care? No.  Actions: * If pain score is 4 or above: No action needed, pain <4.   1. Have you developed a fever since your procedure? No   2.   Have you had an respiratory symptoms (SOB or cough) since your procedure? No   3.   Have you tested positive for COVID 19 since your procedure? No   4.   Have you had any family members/close contacts diagnosed with the COVID 19 since your procedure?  No    If yes to any of these questions please route to Laverna Peace, RN and Karlton Lemon, RN

## 2020-06-06 NOTE — Telephone Encounter (Signed)
Left message on follow up call. 

## 2020-07-04 DIAGNOSIS — Z8719 Personal history of other diseases of the digestive system: Secondary | ICD-10-CM | POA: Insufficient documentation

## 2020-07-20 ENCOUNTER — Ambulatory Visit: Payer: BC Managed Care – PPO | Admitting: Medical

## 2020-08-04 ENCOUNTER — Ambulatory Visit: Payer: BC Managed Care – PPO | Admitting: Gastroenterology

## 2020-09-28 ENCOUNTER — Institutional Professional Consult (permissible substitution): Payer: Self-pay | Admitting: Medical

## 2020-11-11 ENCOUNTER — Other Ambulatory Visit: Payer: Self-pay

## 2020-11-11 ENCOUNTER — Encounter (HOSPITAL_COMMUNITY): Payer: Self-pay

## 2020-11-11 ENCOUNTER — Emergency Department (HOSPITAL_COMMUNITY)
Admission: EM | Admit: 2020-11-11 | Discharge: 2020-11-11 | Disposition: A | Discharge status: Home / Self Care | Payer: BC Managed Care – PPO | Attending: Emergency Medicine | Admitting: Emergency Medicine

## 2020-11-11 DIAGNOSIS — I10 Essential (primary) hypertension: Secondary | ICD-10-CM | POA: Diagnosis not present

## 2020-11-11 DIAGNOSIS — I159 Secondary hypertension, unspecified: Secondary | ICD-10-CM

## 2020-11-11 DIAGNOSIS — Z87891 Personal history of nicotine dependence: Secondary | ICD-10-CM | POA: Insufficient documentation

## 2020-11-11 DIAGNOSIS — Z79899 Other long term (current) drug therapy: Secondary | ICD-10-CM | POA: Insufficient documentation

## 2020-11-11 DIAGNOSIS — R030 Elevated blood-pressure reading, without diagnosis of hypertension: Secondary | ICD-10-CM | POA: Insufficient documentation

## 2020-11-11 DIAGNOSIS — Z7982 Long term (current) use of aspirin: Secondary | ICD-10-CM | POA: Diagnosis not present

## 2020-11-11 LAB — CBC WITH DIFFERENTIAL/PLATELET
Abs Immature Granulocytes: 0.03 10*3/uL (ref 0.00–0.07)
Basophils Absolute: 0.1 10*3/uL (ref 0.0–0.1)
Basophils Relative: 2 %
Eosinophils Absolute: 0.1 10*3/uL (ref 0.0–0.5)
Eosinophils Relative: 2 %
HCT: 41 % (ref 39.0–52.0)
Hemoglobin: 13.4 g/dL (ref 13.0–17.0)
Immature Granulocytes: 0 %
Lymphocytes Relative: 29 %
Lymphs Abs: 2 10*3/uL (ref 0.7–4.0)
MCH: 32 pg (ref 26.0–34.0)
MCHC: 32.7 g/dL (ref 30.0–36.0)
MCV: 97.9 fL (ref 80.0–100.0)
Monocytes Absolute: 0.9 10*3/uL (ref 0.1–1.0)
Monocytes Relative: 13 %
Neutro Abs: 3.7 10*3/uL (ref 1.7–7.7)
Neutrophils Relative %: 54 %
Platelets: 232 10*3/uL (ref 150–400)
RBC: 4.19 MIL/uL — ABNORMAL LOW (ref 4.22–5.81)
RDW: 11.6 % (ref 11.5–15.5)
WBC: 6.8 10*3/uL (ref 4.0–10.5)
nRBC: 0 % (ref 0.0–0.2)

## 2020-11-11 LAB — TROPONIN I (HIGH SENSITIVITY): Troponin I (High Sensitivity): 10 ng/L (ref <18)

## 2020-11-11 LAB — COMPREHENSIVE METABOLIC PANEL
ALT: 48 U/L — ABNORMAL HIGH (ref 0–44)
AST: 47 U/L — ABNORMAL HIGH (ref 15–41)
Albumin: 3.9 g/dL (ref 3.5–5.0)
Alkaline Phosphatase: 40 U/L (ref 38–126)
Anion gap: 9 (ref 5–15)
BUN: 11 mg/dL (ref 6–20)
CO2: 24 mmol/L (ref 22–32)
Calcium: 9.3 mg/dL (ref 8.9–10.3)
Chloride: 103 mmol/L (ref 98–111)
Creatinine, Ser: 1.25 mg/dL — ABNORMAL HIGH (ref 0.61–1.24)
GFR, Estimated: >60 mL/min (ref >60)
Glucose, Bld: 104 mg/dL — ABNORMAL HIGH (ref 70–99)
Potassium: 3.8 mmol/L (ref 3.5–5.1)
Sodium: 136 mmol/L (ref 135–145)
Total Bilirubin: 0.9 mg/dL (ref 0.3–1.2)
Total Protein: 6.9 g/dL (ref 6.5–8.1)

## 2020-11-11 MED ORDER — CLONIDINE HCL 0.1 MG PO TABS
0.1000 mg | ORAL_TABLET | Freq: Once | ORAL | Status: AC
  Administered 2020-11-11: 0.1 mg via ORAL
  Filled 2020-11-11: qty 1

## 2020-11-11 MED ORDER — HYDRALAZINE HCL 25 MG PO TABS
25.0000 mg | ORAL_TABLET | Freq: Three times a day (TID) | ORAL | 6 refills | Status: DC
Start: 2020-11-11 — End: 2022-05-28

## 2020-11-11 NOTE — ED Triage Notes (Signed)
Having blood pressure issues x 3-4 days. Pt noticed BP going up even more especially today 200/102 and having headache.   Denies any dizziness, lightheadedness and chest pain.

## 2020-11-11 NOTE — Discharge Instructions (Addendum)
Increase your hydralazine to 3 times a day as we discussed.  Call your doctor on Monday to schedule a follow-up visit for your blood pressure

## 2020-11-11 NOTE — ED Notes (Signed)
ED Provider at bedside. 

## 2020-11-11 NOTE — ED Provider Notes (Signed)
[facility] EMERGENCY DEPARTMENT Provider Note   CSN: [id number] Arrival date & time: 11/11/20  7035     History No chief complaint on file.   Tony Walker is a 50 y.o. male.  50 year old male who presents with increasing blood pressure at home.  States that he saw his physician on Wednesday for similar symptoms.  No adjustment to his medications were made.  Notes that he has had a mild headache without emesis or neurological features.  Blood pressure at home is running in systolics of 200s.  Denies any chest pain or shortness of breath with this.  Otherwise feels at his baseline.  States compliance with his antihypertensives.      Past Medical History:  Diagnosis Date   Hyperlipidemia 12/18/2011   Hypertension 12/18/2011   Stroke (HCC) 02-2014    Patient Active Problem List   Diagnosis Date Noted   Viral URI with cough 02/22/2015   PCP NOTES >>>>> 02/07/2015   Renal insufficiency 02/21/2014   Abdominal pain, right upper quadrant 02/21/2014   Transaminitis 02/20/2014   Acute CVA (cerebrovascular accident) (HCC) 02/20/2014   ETOH abuse 02/20/2014   AKI (acute kidney injury) (HCC) 02/18/2014   Other malaise and fatigue 10/27/2013   Gingivitis 10/14/2013   Hypertension 12/18/2011   Hyperlipidemia 12/18/2011   Abnormal LFTs 10/03/2011    Past Surgical History:  Procedure Laterality Date   ACHILLES TENDON REPAIR         Family History  Problem Relation Age of Onset   Hypertension Mother    Colon polyps Mother    Alcoholism Father    Colon cancer Neg Hx    Esophageal cancer Neg Hx    Stomach cancer Neg Hx    Rectal cancer Neg Hx     Social History   Tobacco Use   Smoking status: Never   Smokeless tobacco: Former    Types: Chew   Tobacco comments:    d/c cigars 2013  Vaping Use   Vaping Use: Never used  Substance Use Topics   Alcohol use: Not Currently   Drug use: No    Home Medications Prior to Admission medications   Medication  Sig Start Date End Date Taking? Authorizing Provider  aspirin EC 81 MG tablet Take 1 tablet (81 mg total) by mouth daily. 02/20/14   Dhungel, Theda Belfast, MD  B Complex Vitamins (B COMPLEX 1 PO) Take by mouth.    [provider]  carvedilol (COREG) 25 MG tablet Take 1 tablet (25 mg total) by mouth 2 (two) times daily with a meal. 10/22/17 10/15/19  Yu, Amy V, PA-C  hydrALAZINE (APRESOLINE) 25 MG tablet Take 1 tablet (25 mg total) by mouth every 8 (eight) hours. Patient taking differently: Take 25 mg by mouth in the morning and at bedtime. 08/27/16   Deatra Canter, FNP  Multiple Vitamin (MULTIVITAMIN PO) Take by mouth.    [provider]  rosuvastatin (CRESTOR) 10 MG tablet Take 10 mg by mouth at bedtime. 04/20/20   [provider]    Allergies    Patient has no known allergies.  Review of Systems   Review of Systems  All other systems reviewed and are negative.  Physical Exam Updated Vital Signs BP (!) 170/119 (BP Location: Right Arm)   Pulse 72   Temp 98.5 F (36.9 C) (Oral)   Resp 20   Ht 1.905 m (6\' 3" )   Wt 117 kg   SpO2 100%   BMI 32.24 kg/m  Physical Exam Vitals and nursing note reviewed.  Constitutional:      General: He is not in acute distress.    Appearance: Normal appearance. He is well-developed. He is not toxic-appearing.  HENT:     Head: Normocephalic and atraumatic.  Eyes:     General: Lids are normal.     Conjunctiva/sclera: Conjunctivae normal.     Pupils: Pupils are equal, round, and reactive to light.  Neck:     Thyroid: No thyroid mass.     Trachea: No tracheal deviation.  Cardiovascular:     Rate and Rhythm: Normal rate and regular rhythm.     Heart sounds: Normal heart sounds. No murmur heard.   No gallop.  Pulmonary:     Effort: Pulmonary effort is normal. No respiratory distress.     Breath sounds: Normal breath sounds. No stridor. No decreased breath sounds, wheezing, rhonchi or rales.  Abdominal:     General: There is  no distension.     Palpations: Abdomen is soft.     Tenderness: There is no abdominal tenderness. There is no rebound.  Musculoskeletal:        General: No tenderness. Normal range of motion.     Cervical back: Normal range of motion and neck supple.  Skin:    General: Skin is warm and dry.     Findings: No abrasion or rash.  Neurological:     Mental Status: He is alert and oriented to person, place, and time. Mental status is at baseline.     GCS: GCS eye subscore is 4. GCS verbal subscore is 5. GCS motor subscore is 6.     Cranial Nerves: Cranial nerves are intact. No cranial nerve deficit.     Sensory: No sensory deficit.     Motor: Motor function is intact.  Psychiatric:        Attention and Perception: Attention normal.        Speech: Speech normal.        Behavior: Behavior normal.    ED Results / Procedures / Treatments   Labs (all labs ordered are listed, but only abnormal results are displayed) Labs Reviewed  CBC WITH DIFFERENTIAL/PLATELET - Abnormal; Notable for the following components:      Result Value   RBC 4.19 (*)    All other components within normal limits  COMPREHENSIVE METABOLIC PANEL - Abnormal; Notable for the following components:   Glucose, Bld 104 (*)    Creatinine, Ser 1.25 (*)    AST 47 (*)    ALT 48 (*)    All other components within normal limits  TROPONIN I (HIGH SENSITIVITY)  TROPONIN I (HIGH SENSITIVITY)    EKG None  Radiology No results found.  Procedures Procedures   Medications Ordered in ED Medications  cloNIDine (CATAPRES) tablet 0.1 mg (has no administration in time range)    ED Course  I have reviewed the triage vital signs and the nursing notes.  Pertinent labs & imaging results that were available during my care of the patient were reviewed by me and considered in my medical decision making (see chart for details).    MDM Rules/Calculators/A&P                           Patient given clonidine here and blood  pressure has improved.  Labs are reassuring with exception of mild increase kidney function.  We will increase patient's hydralazine to 3 times daily and  he will see his doctor next week Final Clinical Impression(s) / ED Diagnoses Final diagnoses:  None    Rx / DC Orders ED Discharge Orders     None        Lorre Nick, MD 11/11/20 1045

## 2020-11-15 ENCOUNTER — Emergency Department (HOSPITAL_COMMUNITY)
Admission: EM | Admit: 2020-11-15 | Discharge: 2020-11-15 | Disposition: A | Discharge status: Home / Self Care | Payer: BC Managed Care – PPO | Attending: Emergency Medicine | Admitting: Emergency Medicine

## 2020-11-15 DIAGNOSIS — Z5321 Procedure and treatment not carried out due to patient leaving prior to being seen by health care provider: Secondary | ICD-10-CM | POA: Diagnosis not present

## 2020-11-15 DIAGNOSIS — I10 Essential (primary) hypertension: Secondary | ICD-10-CM | POA: Insufficient documentation

## 2020-11-15 NOTE — ED Notes (Signed)
Patient states his doctor told him to come to emergency room if his blood pressure is over 180 systolic. After [place] checking his vitals he decided he was ok and wanted to leave.

## 2022-05-28 ENCOUNTER — Ambulatory Visit (INDEPENDENT_AMBULATORY_CARE_PROVIDER_SITE_OTHER): Payer: Managed Care, Other (non HMO) | Admitting: Family Medicine

## 2022-05-28 ENCOUNTER — Encounter: Payer: Self-pay | Admitting: Family Medicine

## 2022-05-28 VITALS — BP 150/81 | HR 71 | Temp 98.2°F | Resp 16 | Ht 75.0 in | Wt 297.0 lb

## 2022-05-28 DIAGNOSIS — I10 Essential (primary) hypertension: Secondary | ICD-10-CM

## 2022-05-28 MED ORDER — TRIAMTERENE-HCTZ 37.5-25 MG PO CAPS: 1.0000 | ORAL_CAPSULE | Freq: Every day | ORAL | 2 refills | Status: DC

## 2022-05-28 MED ORDER — AMLODIPINE BESYLATE 10 MG PO TABS: 10.0000 mg | ORAL_TABLET | Freq: Every day | ORAL | 0 refills | Status: DC

## 2022-05-28 MED ORDER — CARVEDILOL 25 MG PO TABS: 25.0000 mg | ORAL_TABLET | Freq: Two times a day (BID) | ORAL | 0 refills | Status: AC

## 2022-05-28 NOTE — Progress Notes (Signed)
Established Patient Office Visit   Subjective:  Patient ID: Tony Walker, male    DOB: January 29, 1971  Age: 52 y.o. MRN: [id number]  Chief Complaint  Patient presents with   [place] a new pcp.     HPI Encounter Diagnoses  Name Primary?   Essential hypertension Yes   For establishment of care and follow-up of his blood pressure.  Longstanding history of hypertension.  He is currently taking amlodipine 10 mg (for 2 months now), carvedilol 25 twice daily and hydralazine 25 mg twice daily.  HCTZ 12.5 mg was recently added which she has not started as of yet.  Pressures are running higher in the morning at home and lower in the afternoon and evenings.  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  Both of his parents have hypertension.  He does not drink alcohol or use illicit drugs.  He does chew tobacco on occasion.  He is going to the gym working out in attempting to lose some weight.  Review of the chart shows a normal CMP obtained back in December.   Review of Systems  Constitutional: Negative.   HENT: Negative.    Eyes:  Negative for blurred vision, discharge and redness.  Respiratory: Negative.    Cardiovascular: Negative.   Gastrointestinal:  Negative for abdominal pain.  Genitourinary: Negative.   Musculoskeletal: Negative.  Negative for myalgias.  Skin:  Negative for rash.  Neurological:  Negative for tingling, loss of consciousness and weakness.  Endo/Heme/Allergies:  Negative for polydipsia.     Current Outpatient Medications:    aspirin EC 81 MG tablet, Take 1 tablet (81 mg total) by mouth daily., Disp: 30 tablet, Rfl: 0   Multiple Vitamin (MULTIVITAMIN PO), Take by mouth., Disp: , Rfl:    testosterone cypionate (DEPOTESTOSTERONE CYPIONATE) 200 MG/ML injection, SMARTSIG:0.5 Milliliter(s) IM Once a Week, Disp: , Rfl:    triamterene-hydrochlorothiazide (DYAZIDE) 37.5-25 MG capsule, Take 1 each (1 capsule total) by mouth daily. Each morning, Disp: 30  capsule, Rfl: 2   amLODipine (NORVASC) 10 MG tablet, Take 1 tablet (10 mg total) by mouth daily., Disp: 90 tablet, Rfl: 0   B-D INTEGRA SYRINGE 23G X 1" 3 ML MISC, SMARTSIG:1 IM Every 2 Weeks, Disp: , Rfl:    busPIRone (BUSPAR) 15 MG tablet, TAKE ONE TABLET BY MOUTH 2 TIMES DAILY. (Patient not taking: Reported on 05/28/2022), Disp: , Rfl:    carvedilol (COREG) 25 MG tablet, Take 1 tablet (25 mg total) by mouth 2 (two) times daily with a meal., Disp: 180 tablet, Rfl: 0   hydrOXYzine (ATARAX) 25 MG tablet, Take by mouth. (Patient not taking: Reported on 05/28/2022), Disp: , Rfl:    rosuvastatin (CRESTOR) 10 MG tablet, Take 10 mg by mouth at bedtime. (Patient not taking: Reported on 05/28/2022), Disp: , Rfl:   Current Facility-Administered Medications:    0.9 %  sodium chloride infusion, 500 mL, Intravenous, Once, Mansouraty, Telford Nab., MD   Objective:     BP (!) 150/81 (BP Location: Left Arm, Patient Position: Sitting, Cuff Size: Large)   Pulse 71   Temp 98.2 F (36.8 C) (Temporal)   Resp 16   Ht '6\' 3"'$  (1.905 m)   Wt 297 lb (134.7 kg)   SpO2 97%   BMI 37.12 kg/m    Physical Exam Constitutional:      General: He is not in acute distress.    Appearance: Normal appearance. He is not ill-appearing, toxic-appearing or diaphoretic.  HENT:     Head: Normocephalic and atraumatic.     Right Ear: External ear normal.     Left Ear: External ear normal.     Mouth/Throat:     Mouth: Mucous membranes are moist.     Pharynx: Oropharynx is clear. No oropharyngeal exudate or posterior oropharyngeal erythema.  Eyes:     General: No scleral icterus.       Right eye: No discharge.        Left eye: No discharge.     Extraocular Movements: Extraocular movements intact.     Conjunctiva/sclera: Conjunctivae normal.     Pupils: Pupils are equal, round, and reactive to light.  Cardiovascular:     Rate and Rhythm: Normal rate and regular rhythm.  Pulmonary:     Effort: Pulmonary effort is normal. No  respiratory distress.     Breath sounds: Normal breath sounds.  Musculoskeletal:     Cervical back: No rigidity or tenderness.  Skin:    General: Skin is warm and dry.  Neurological:     Mental Status: He is alert and oriented to person, place, and time.  Psychiatric:        Mood and Affect: Mood normal.        Behavior: Behavior normal.      No results found for any visits on 05/28/22.    The ASCVD Risk score (Arnett DK, et al., 2019) failed to calculate for the following reasons:   The patient has a prior MI or stroke diagnosis    Assessment & Plan:   Essential hypertension -     Carvedilol; Take 1 tablet (25 mg total) by mouth 2 (two) times daily with a meal.  Dispense: 180 tablet; Refill: 0 -     amLODIPine Besylate; Take 1 tablet (10 mg total) by mouth daily.  Dispense: 90 tablet; Refill: 0 -     Triamterene-HCTZ; Take 1 each (1 capsule total) by mouth daily. Each morning  Dispense: 30 capsule; Refill: 2    Return in about 6 weeks (around 07/09/2022), or stop hydralazine and start dyazide. Please bring cuff with you..  Patient will stop hydralazine.  He will continue amlodipine 10 mg daily, carvedilol 25 twice daily.  Have added Dyazide every morning.  Information was given on Dyazide.  Information was given on managing hypertension.  Will continue exercise and his weight loss journey.  Avoid sodium.  Libby Maw, MD

## 2022-06-04 ENCOUNTER — Ambulatory Visit: Payer: Managed Care, Other (non HMO) | Admitting: Family Medicine

## 2022-06-04 ENCOUNTER — Telehealth: Payer: Self-pay | Admitting: Family Medicine

## 2022-06-04 NOTE — Telephone Encounter (Signed)
Pt called to say he got called into work unexpectedly this morning.

## 2022-06-06 ENCOUNTER — Encounter: Payer: Self-pay | Admitting: Family Medicine

## 2022-06-06 NOTE — Telephone Encounter (Signed)
Same day cancel, fee waived, letter sent via mychart - 1st occurrence

## 2022-08-16 ENCOUNTER — Other Ambulatory Visit: Payer: Self-pay | Admitting: Family Medicine

## 2022-08-16 DIAGNOSIS — I10 Essential (primary) hypertension: Secondary | ICD-10-CM

## 2023-08-12 ENCOUNTER — Other Ambulatory Visit: Payer: Self-pay | Admitting: Family Medicine

## 2023-08-12 DIAGNOSIS — I10 Essential (primary) hypertension: Secondary | ICD-10-CM

## 2023-09-29 ENCOUNTER — Other Ambulatory Visit: Payer: Self-pay | Admitting: Family Medicine

## 2023-09-29 DIAGNOSIS — I10 Essential (primary) hypertension: Secondary | ICD-10-CM

## 2023-11-07 NOTE — Progress Notes (Addendum)
 Patient ID:  Tony Walker is a 53 y.o. (DOB 1970-10-14) male.   Assessment and Plan   Assessment & Plan Right ear canal abrasion - Bright red blood was noted on Q-tips, with no associated pain or pressure. - Physical exam revealed an intact eardrum with a minor scratch and some fresh and surrounding old, dried blood present. - Discussed the likely cause being a scratch from the Q-tip, reassured that the eardrum is not ruptured, and hearing is unaffected.  - Advised to avoid using Q-tips to clean the ear.  - Suggested the use of peroxide for cleaning the ear.   1. Abrasion of right ear canal, initial encounter (Primary)    Risks, benefits, and alternatives of the medications and treatment plan prescribed today were discussed, and patient expressed understanding.   Follow up for symptoms not resolved, worsening of symptoms.    Subjective  Patient ID:  Tony Walker is a 53 y.o. (DOB 11-13-1970) male    Patient presents with  . Ear Drainage    Patient states right ear draining blood last pm     Pre-visit planning completed through chart review and utilization of SnapShot feature.   History of Present Illness The patient presents for evaluation of right ear bleeding.  He reports experiencing bright red blood from his right ear, which he noticed on a Q-tip. He has not observed any bleeding from his left ear. The bleeding was significant enough to stain 2 or 3 Q-tips. This morning, he felt a slight runny sensation in his ear and upon wiping the outer part with a Q-tip, he found it was still bleeding. He does not report any pressure sensation behind the ear or recent URI symptoms. He frequently uses Q-tips and recalls an incident where he applied too much pressure, causing the cotton aspect to detach from the plastic without his immediate knowledge. He continued to clean his ear canal, unaware of the broken Q-tip. He confirms that hearing has not been affected.     Reviewed and  updated this visit by provider: Tobacco  Allergies  Meds  Problems  Med Hx  Surg Hx  Fam Hx        Review of Systems See HPI for ROS. Except as stated in the HPI, all other systems reviewed and are negative.   Objective   Vitals:   11/07/23 1540  BP: 117/75  Patient Position: Sitting  Pulse: 84  Temp: 97.5 F (36.4 C)  TempSrc: Temporal  Resp: 18  Height: 6' 3 (1.905 m)  Weight: 254 lb 9.6 oz (115.5 kg)  SpO2: 99%  BMI (Calculated): 31.8  PainSc: 0-No pain    Physical Exam Vitals and nursing note reviewed.  Constitutional:      General: He is not in acute distress.    Appearance: Normal appearance. He is not ill-appearing.  HENT:     Right Ear: Hearing, tympanic membrane and external ear normal. Laceration (external auditory canal: small abrasion covered with bright red blood, surrounded by small amount of old, dried blood; no signs of infection) present. No swelling or tenderness.     Left Ear: Hearing, tympanic membrane, ear canal and external ear normal.  Cardiovascular:     Rate and Rhythm: Normal rate and regular rhythm.     Pulses: Normal pulses.     Heart sounds: Normal heart sounds.  Musculoskeletal:     Right lower leg: No edema.     Left lower leg: No edema.  Pulmonary:  Effort: Pulmonary effort is normal.     Breath sounds: Normal breath sounds.  Skin:    General: Skin is warm and dry.     Capillary Refill: Capillary refill takes less than 2 seconds.  Neurological:     Mental Status: He is alert and oriented to person, place, and time.  Psychiatric:        Mood and Affect: Mood normal.        Behavior: Behavior normal. Behavior is cooperative.     Note: Voice-recognition software was used in [employer], [employer] of this documentation. Unintended transcription errors may have escaped editorial review. Computer technology was used to create visit note. Consent from the patient/caregiver was obtained prior to its use.  I have reviewed the information  contained in this note and personally verified its accuracy.  I obtained or reviewed the history of present illness and personally performed the physical exam.  Arva Essex, FNP-C

## 2024-01-07 ENCOUNTER — Other Ambulatory Visit

## 2024-01-07 ENCOUNTER — Ambulatory Visit: Payer: PRIVATE HEALTH INSURANCE | Admitting: Endocrinology

## 2024-01-07 ENCOUNTER — Encounter: Payer: Self-pay | Admitting: Endocrinology

## 2024-01-07 VITALS — BP 118/82 | HR 77 | Resp 20 | Ht 75.0 in | Wt 250.8 lb

## 2024-01-07 DIAGNOSIS — E291 Testicular hypofunction: Secondary | ICD-10-CM

## 2024-01-07 DIAGNOSIS — R748 Abnormal levels of other serum enzymes: Secondary | ICD-10-CM | POA: Diagnosis not present

## 2024-01-07 NOTE — Progress Notes (Signed)
 Outpatient Endocrinology Note [place] Cinzia Devos, MD   Patient's Name: Tony Walker    DOB: [birth year]    MRN: [id number]  REASON OF VISIT: New consult for low testosterone / primary hypogonadism  REFERRING PROVIDER:   PCP:  Berneta Elsie Sayre, MD  HISTORY OF PRESENT ILLNESS:   CHANTRY HEADEN is a 53 y.o. old male with past medical history listed below, is here for new consult for primary hypogonadism.  Pertinent Hx: Patient is referred to endocrinology for evaluation and management of primary hypogonadism, initial consult on January 07, 2024.  Patient was diagnosed with primary hypogonadism based on low normal total testosterone, significantly low free testosterone and elevated gonadotropins in November 2022, when evaluated by endocrinology  /endocrinologist Dr. Hosie at [facility].  He was treated with testosterone therapy/testosterone cypionate 200 mg every 2 weeks, treatment initiated in June 2023.  Transdermal testosterone replacement/AndroGel was not cost effective at that time.  He was last time seen by endocrinologist at [facility] was in December 2023.  Medical records reviewed in Care Everywhere.  His testosterone was checked initially by primary care provider in 2022 for evaluation of fatigue and erectile dysfunction.  He had total testosterone in the low normal range in 300s but free testosterone consistently very low.  He had elevated gonadotropins in November 2022 consistent with having primary hypogonadism.  Prolactin was normal.  Sex hormone binding globulin was high normal.  He has not been on testosterone treatment for more than a year, was not able to maintain follow-up visit with endocrinologist.  After not being on testosterone treatment he has been having low libido and low energy level.  # Pertinent Hx: Libido:                          Decreased  Erectile Dysfunction: No  Gynecomastia:           No currently.  He developed gynecomastia couple of years ago, had  diagnostic mammogram in January 2022 with documented gynecomastia findings.  Mammogram in October 10, 2020 : FINDINGS: The breast tissue is heterogeneously dense, which could obscure detection of small masses (approximately 51-75% glandular). There is severe gynecomastia throughout the left breast. Mild to moderate gynecomastia in the retroareolar right breast. No findings specific for malignancy in either breast. Left breast ultrasound was performed for further evaluation. There is severe gynecomastia within the left breast. This likely accounts for the lump and tenderness. No findings for malignancy.   Fathered any child:     No biological children.  10+ years ago his first wife became pregnant apparently resulted in a tubal pregnancy.  During fertility evaluation at that time he was told to have full sperm count.  He is no longer interested for fathering a child.  He has history of alcohol abuse in the past.  Shaving less often:     No Opioid use:                  No Decreased strength    No Decreased Muscle      No Tiredness/Fatigue       Yes Anabolic steroids:        No Weight lifting:              No Excessive exercise:    No Fractures:                    No Vision issues  No Sense of Smell           Intact Truama, Radiation, Mumps  No.  He denies change in size of his testicle, denies decrease in size of testicle.  Laboratory evaluation in November 2022 baseline levels: Total testosterone 361, free testosterone 4.1, gonadotropin LH 16.8, FSH 27.6. Repeat labs in June 2023 with total testosterone 283, free testosterone 2.9, LH 11.1, FSH 23.4.  Prolactin was 11.3 normal in November 2022.  SHBG 61.4 with reference range of 19.3-76.4  In December 2023 total testosterone was 1232 with free testosterone of 19.2 when he was taking injectable testosterone therapy.  All other laboratory results : He had normal thyroid function test.  PSA was normal persistently and most recent  PSA was 0.4 in May 2025.  He had normal renal function and noted elevated liver enzymes in May 2025 with AST 140, ALT 61.  No genetic testing/karyotyping was done in the past based on record review.  Interval history Patient presented to establish care for known history of having primary hypogonadism.  He was evaluated and diagnosed with primary hypogonadism in November 2022, he used to follow-up with endocrinology at [facility] has not seen for a long time.  He wants to restart testosterone therapy.  REVIEW OF SYSTEMS:  As per history of present illness.   PAST MEDICAL HISTORY: Past Medical History:  Diagnosis Date   Hyperlipidemia 12/18/2011   Hypertension 12/18/2011   Stroke (HCC) 02-2014    PAST SURGICAL HISTORY: Past Surgical History:  Procedure Laterality Date   ACHILLES TENDON REPAIR      ALLERGIES: No Known Allergies  FAMILY HISTORY:  Family History  Problem Relation Age of Onset   Hypertension Mother    Colon polyps Mother    Stroke Mother    Alcoholism Father    Alcohol abuse Father    Diabetes Maternal Aunt    Colon cancer Neg Hx    Esophageal cancer Neg Hx    Stomach cancer Neg Hx    Rectal cancer Neg Hx     SOCIAL HISTORY: Social History   Socioeconomic History   Marital status: Married    Spouse name: Not on file   Number of children: Not on file   Years of education: Not on file   Highest education level: Not on file  Occupational History   Occupation: Psychologist, sport and exercise: [employer]  Tobacco Use   Smoking status: Former    Types: Cigars   Smokeless tobacco: Former    Types: Chew   Tobacco comments:    d/c cigars 2013  Vaping Use   Vaping status: Never Used  Substance and Sexual Activity   Alcohol use: Not Currently   Drug use: No   Sexual activity: Yes  Other Topics Concern   Not on file  Social History Narrative      Moved to the area ~ 2011, from [place]      Social Drivers of Health   Financial Resource Strain: Low Risk   (07/18/2023)   Received from [facility]   Overall Financial Resource Strain (CARDIA)    Difficulty of Paying Living Expenses: Not hard at all  Food Insecurity: No Food Insecurity (07/18/2023)   Received from [facility]   Hunger Vital Sign    Within the past 12 months, you worried that your food would run out before you got the money to buy more.: Never true    Within the past 12 months, the food you bought  just didn't last and you didn't have money to get more.: Never true  Transportation Needs: No Transportation Needs (07/18/2023)   Received from [facility]   PRAPARE - Transportation    Lack of Transportation (Medical): No    Lack of Transportation (Non-Medical): No  Physical Activity: Sufficiently Active (07/18/2023)   Received from [facility]   Exercise Vital Sign    On average, how many days per week do you engage in moderate to strenuous exercise (like a brisk walk)?: 4 days    On average, how many minutes do you engage in exercise at this level?: 60 min  Stress: No Stress Concern Present (07/18/2023)   Received from [facility] - Occupational Stress Questionnaire    Feeling of Stress : Only a little  Social Connections: Socially Integrated (07/18/2023)   Received from [facility]   Social Network    How would you rate your social network (family, work, friends)?: Good participation with social networks    MEDICATIONS:  Current Outpatient Medications  Medication Sig Dispense Refill   aspirin  EC 81 MG tablet Take 1 tablet (81 mg total) by mouth daily. 30 tablet 0   B-D INTEGRA SYRINGE 23G X 1 3 ML MISC SMARTSIG:1 IM Every 2 Weeks     carvedilol  (COREG ) 25 MG tablet Take 1 tablet (25 mg total) by mouth 2 (two) times daily with a meal. 180 tablet 0   hydrOXYzine (ATARAX) 25 MG tablet Take by mouth. (Patient taking differently: Take by mouth. As needed)     lisinopril (ZESTRIL) 5 MG tablet Take 5 mg by mouth daily.     Multiple  Vitamin (MULTIVITAMIN PO) Take by mouth.     rosuvastatin (CRESTOR) 10 MG tablet Take 10 mg by mouth at bedtime.     testosterone cypionate (DEPOTESTOSTERONE CYPIONATE) 200 MG/ML injection SMARTSIG:0.5 Milliliter(s) IM Once a Week     Current Facility-Administered Medications  Medication Dose Route Frequency Provider Last Rate Last Admin   0.9 %  sodium chloride  infusion  500 mL Intravenous Once Mansouraty, Aloha Raddle., MD        PHYSICAL EXAM: Vitals:   01/07/24 0807  BP: 118/82  Pulse: 77  Resp: 20  SpO2: 99%  Weight: 250 lb 12.8 oz (113.8 kg)  Height: 6' 3 (1.905 m)   Body mass index is 31.35 kg/m.  Wt Readings from Last 3 Encounters:  01/07/24 250 lb 12.8 oz (113.8 kg)  05/28/22 297 lb (134.7 kg)  11/11/20 257 lb 15 oz (117 kg)    General: Well developed, well nourished male in no apparent distress. Non-Cushingoid. HEENT: AT/Wright, no external lesions. Hearing intact to the spoken word Eyes: EOMI. No proptosis, stare and lid lag. Conjunctiva clear and no icterus. Visual field grossly intact in confrontation Neck: Trachea midline, neck supple without appreciable thyromegaly or lymphadenopathy and no palpable thyroid nodules Lungs: Clear to auscultation, no wheeze. Respirations not labored Heart: S1S2, Regular in rate and rhythm. No loud murmurs Abdomen: Soft, non tender, non distended Neurologic: Alert, oriented, normal speech, deep tendon biceps reflexes normal,  no gross focal neurological deficit. No obvious proximal weakness Extremities: No pedal pitting edema, no tremors of outstretched hands, no excessive hair growth Skin: Warm, color good. Psychiatric: Does not appear depressed or anxious  PERTINENT HISTORIC LABORATORY AND IMAGING STUDIES:  All pertinent laboratory results were reviewed. Please see HPI also for further details.   ASSESSMENT / PLAN  1. Primary hypogonadism in male  2. Elevated liver enzymes    Patient was diagnosed with primary hypogonadism in  November 2022 with low normal total testosterone, significantly low free testosterone and elevated gonadotropins.  Detail laboratory results as noted in HPI.  He had gynecomastia had mammogram in January 2022.  Records reviewed in Care Everywhere.  He used to be on testosterone therapy in the form of testosterone cypionate 200 mg every 2 weeks has not been on it for more than a year.  He had significant high total testosterone of more than 1200 to in December 2023, while on testosterone therapy.    In May 2025 he had elevated liver enzymes, normal PSA and normal hematocrit.  Patient wants to restart testosterone therapy.  He has no plan for fathering a child.  He has complaints of low energy and low libido.  Plan: - I would like to have laboratory evaluation with total, free testosterone along with gonadotropins LH, FSH. -I would also like to check prolactin and iron studies and sex hormone binding globulin. - Consider karyotype testing for the etiology of primary hypogonadism.  Based on chart review it was not done in the past.  No other obvious etiology to cause primary hypogonadism.  Patient is not interested at this time. - Based on the test results from today we will start testosterone therapy.  He does not like daily topical testosterone, he prefers to be on injectable testosterone therapy.  # He had elevated liver enzymes based on laboratory test completed at [facility] in May 2025 monitored by primary care provider. - I would like to recheck CMP for liver enzyme.  Possible complications of testosterone supplementation include: Progression of prostate CA/increase in PSA Decreases in HDL Acne Oily skin Testicular atrophy Gynecomastia Polycythemia Sleep apnea  Likely benefits of testosterone supplementation include improvements in: Strength and muscle mass Sense of well-being Sexual activity/ libido Energy  Decreased overall body fat  Diagnoses and all orders for this  visit:  Primary hypogonadism in male -     Follicle stimulating hormone -     Luteinizing hormone -     Testosterone , Free and Total -     Sex hormone binding globulin -     Iron, TIBC and Ferritin Panel -     Prolactin  Elevated liver enzymes -     Comprehensive metabolic panel with GFR    DISPOSITION Follow up in clinic in 3 months suggested.  Today and prior to follow-up visit.  All questions answered and patient verbalized understanding of the plan.  Iraq Tondalaya Perren, MD [facility] Endocrinology [facility] [address] [place], [address] [place], [place] [address] Phone # [phone]  At least part of this note was generated using voice recognition software. Inadvertent word errors may have occurred, which were not recognized during the proofreading process.

## 2024-01-11 ENCOUNTER — Encounter: Payer: Self-pay | Admitting: Endocrinology

## 2024-01-12 NOTE — Telephone Encounter (Signed)
 I am waiting on the test results of the testosterone level, I will get back to him after all the the test results.  Current test results of LH and FSH is consistent with having his history of low testosterone level.  Charita Lindenberger, MD [facility] Endocrinology [facility] [address] [place], [address] [place], [place] [address] Phone # [phone]

## 2024-01-13 ENCOUNTER — Ambulatory Visit: Payer: Self-pay | Admitting: Endocrinology

## 2024-01-13 DIAGNOSIS — E291 Testicular hypofunction: Secondary | ICD-10-CM

## 2024-01-13 LAB — LUTEINIZING HORMONE: LH: 42.9 m[IU]/mL — ABNORMAL HIGH (ref 1.5–9.3)

## 2024-01-13 LAB — IRON,TIBC AND FERRITIN PANEL
%SAT: 33 % (ref 20–48)
Ferritin: 215 ng/mL (ref 38–380)
Iron: 98 ug/dL (ref 50–180)
TIBC: 293 ug/dL (ref 250–425)

## 2024-01-13 LAB — COMPREHENSIVE METABOLIC PANEL WITH GFR
AG Ratio: 1.8 (calc) (ref 1.0–2.5)
ALT: 31 U/L (ref 9–46)
AST: 29 U/L (ref 10–35)
Albumin: 4.9 g/dL (ref 3.6–5.1)
Alkaline phosphatase (APISO): 48 U/L (ref 35–144)
BUN: 18 mg/dL (ref 7–25)
CO2: 29 mmol/L (ref 20–32)
Calcium: 10.4 mg/dL — ABNORMAL HIGH (ref 8.6–10.3)
Chloride: 103 mmol/L (ref 98–110)
Creat: 1.16 mg/dL (ref 0.70–1.30)
Globulin: 2.7 g/dL (ref 1.9–3.7)
Glucose, Bld: 94 mg/dL (ref 65–99)
Potassium: 4.7 mmol/L (ref 3.5–5.3)
Sodium: 140 mmol/L (ref 135–146)
Total Bilirubin: 0.6 mg/dL (ref 0.2–1.2)
Total Protein: 7.6 g/dL (ref 6.1–8.1)
eGFR: 75 mL/min/1.73m2 (ref >=60)

## 2024-01-13 LAB — PROLACTIN: Prolactin: 4.7 ng/mL (ref 2.0–18.0)

## 2024-01-13 LAB — FOLLICLE STIMULATING HORMONE: FSH: 59.9 m[IU]/mL — ABNORMAL HIGH (ref 1.4–12.8)

## 2024-01-13 LAB — SEX HORMONE BINDING GLOBULIN: Sex Hormone Binding: 64 nmol/L — ABNORMAL HIGH (ref 10–50)

## 2024-01-13 LAB — TESTOSTERONE, FREE & TOTAL
Free Testosterone: 8.1 pg/mL — ABNORMAL LOW (ref 35.0–155.0)
Testosterone, Total, LC-MS-MS: 91 ng/dL — ABNORMAL LOW (ref 250–1100)

## 2024-01-13 MED ORDER — "SYRINGE 23G X 1"" 3 ML MISC": 1 refills | Status: AC

## 2024-01-13 MED ORDER — TESTOSTERONE CYPIONATE 200 MG/ML IM SOLN: 200.0000 mg | INTRAMUSCULAR | 2 refills | Status: AC

## 2024-01-13 NOTE — Progress Notes (Signed)
 Labs reviewed low testosterone level with significantly elevated gonadotropin consistent with having primary hypogonadism.  Mildly elevated sexual binding globulin.  Liver enzymes normal.  Serum calcium  10.4 with albumin of 4.9 and corrected serum calcium  is normal, 9.7.  Normal iron studies.  Prolactin normal.  Start testosterone cypionate 200 mg every 2 weeks.  Check testosterone level as a scheduled in January orders placed.  Sent prescription for testosterone.  If he needs set up nurse visit for testosterone injection first time in the clinic.

## 2024-01-14 ENCOUNTER — Other Ambulatory Visit (HOSPITAL_COMMUNITY): Payer: Self-pay

## 2024-01-14 ENCOUNTER — Telehealth: Payer: Self-pay

## 2024-01-14 NOTE — Telephone Encounter (Signed)
-----   Message from Sudan Thapa sent at 01/13/2024  3:47 PM EDT ----- Labs reviewed low testosterone level with significantly elevated gonadotropin consistent with having primary hypogonadism.  Mildly elevated sexual binding globulin.  Liver enzymes normal.  Serum  calcium  10.4 with albumin of 4.9 and corrected serum calcium  is normal, 9.7.  Normal iron studies.  Prolactin normal.  Start testosterone cypionate 200 mg every 2 weeks.  Check testosterone level as a scheduled in January orders placed.  Sent prescription for testosterone.  If he needs set up nurse visit for  testosterone injection first time in the clinic.   ----- Message ----- From: Interface, [facility] Results In Sent: 01/07/2024  11:06 PM EDT To: Sudan Thapa, MD

## 2024-01-14 NOTE — Telephone Encounter (Signed)
 Pharmacy Patient Advocate Encounter   Received notification from RX Request Messages that prior authorization for Testosterone Cypionate 200MG /ML intramuscular solution is required/requested.   Insurance verification completed.   The patient is insured through [facility].   Per test claim: PA required and submitted KEY/EOC/Request #: [id number] by the processor we sent it to: Resubmitted through Phone to [facility] where they stated that they do not handle PA's for this pt. They gave me a different phone number to call [phone]). The answering service stated they were closed when I tried to call. Will follow up when they open tomorrow.

## 2024-01-14 NOTE — Telephone Encounter (Signed)
 PA needed for Testosterone per patient

## 2024-03-29 ENCOUNTER — Other Ambulatory Visit

## 2024-03-31 ENCOUNTER — Other Ambulatory Visit

## 2024-04-01 LAB — COMPREHENSIVE METABOLIC PANEL WITH GFR
AG Ratio: 1.8 (calc) (ref 1.0–2.5)
ALT: 28 U/L (ref 9–46)
AST: 29 U/L (ref 10–35)
Albumin: 4.9 g/dL (ref 3.6–5.1)
Alkaline phosphatase (APISO): 51 U/L (ref 35–144)
BUN: 16 mg/dL (ref 7–25)
CO2: 23 mmol/L (ref 20–32)
Calcium: 9.8 mg/dL (ref 8.6–10.3)
Chloride: 104 mmol/L (ref 98–110)
Creat: 1.07 mg/dL (ref 0.70–1.30)
Globulin: 2.8 g/dL (ref 1.9–3.7)
Glucose, Bld: 120 mg/dL — ABNORMAL HIGH (ref 65–99)
Potassium: 4.6 mmol/L (ref 3.5–5.3)
Sodium: 139 mmol/L (ref 135–146)
Total Bilirubin: 0.7 mg/dL (ref 0.2–1.2)
Total Protein: 7.7 g/dL (ref 6.1–8.1)
eGFR: 83 mL/min/1.73m2

## 2024-04-02 ENCOUNTER — Other Ambulatory Visit (HOSPITAL_COMMUNITY): Payer: Self-pay

## 2024-04-02 ENCOUNTER — Telehealth: Payer: Self-pay

## 2024-04-04 LAB — TESTOSTERONE, FREE & TOTAL
Free Testosterone: 34.8 pg/mL — ABNORMAL LOW (ref 35.0–155.0)
Testosterone, Total, LC-MS-MS: 285 ng/dL (ref 250–1100)

## 2024-04-09 ENCOUNTER — Ambulatory Visit: Admitting: Endocrinology

## 2024-04-14 ENCOUNTER — Ambulatory Visit: Admitting: Endocrinology

## 2024-04-16 ENCOUNTER — Other Ambulatory Visit (HOSPITAL_COMMUNITY): Payer: Self-pay

## 2024-04-16 NOTE — Telephone Encounter (Signed)
 Pharmacy Patient Advocate Encounter   Received notification from Pt Calls Messages that prior authorization for Testosterone  Cypionate 200MG /ML intramuscular solution is required/requested.   Insurance verification completed.   The patient is insured through [employer] ([facility]   Insurance would not let me submit a PA electronically. Called insurance at [phone] to request PA. They will be faxing a form. Once received, I will fill out and fax back.
